# Patient Record
Sex: Female | Born: 1950 | Race: White | Hispanic: No | Marital: Married | Smoking: Never smoker
Health system: Southern US, Community
[De-identification: ages and names within clinical notes are randomized; demographics above are authoritative.]

## PROBLEM LIST (undated history)

## (undated) DIAGNOSIS — Q828 Other specified congenital malformations of skin: Secondary | ICD-10-CM

## (undated) DIAGNOSIS — Z86711 Personal history of pulmonary embolism: Secondary | ICD-10-CM

## (undated) DIAGNOSIS — R350 Frequency of micturition: Secondary | ICD-10-CM

## (undated) DIAGNOSIS — I10 Essential (primary) hypertension: Secondary | ICD-10-CM

## (undated) DIAGNOSIS — I739 Peripheral vascular disease, unspecified: Secondary | ICD-10-CM

## (undated) DIAGNOSIS — M1990 Unspecified osteoarthritis, unspecified site: Secondary | ICD-10-CM

## (undated) DIAGNOSIS — Z833 Family history of diabetes mellitus: Secondary | ICD-10-CM

## (undated) DIAGNOSIS — H269 Unspecified cataract: Secondary | ICD-10-CM

## (undated) DIAGNOSIS — T7840XA Allergy, unspecified, initial encounter: Secondary | ICD-10-CM

## (undated) HISTORY — PX: KNEE ARTHROSCOPY: SUR90

## (undated) HISTORY — DX: Personal history of pulmonary embolism: Z86.711

## (undated) HISTORY — DX: Unspecified osteoarthritis, unspecified site: M19.90

## (undated) HISTORY — PX: JOINT REPLACEMENT: SHX530

## (undated) HISTORY — PX: OTHER SURGICAL HISTORY: SHX169

## (undated) HISTORY — DX: Essential (primary) hypertension: I10

## (undated) HISTORY — DX: Other specified congenital malformations of skin: Q82.8

## (undated) HISTORY — PX: TONSILLECTOMY: SUR1361

## (undated) HISTORY — DX: Unspecified cataract: H26.9

## (undated) HISTORY — DX: Family history of diabetes mellitus: Z83.3

## (undated) HISTORY — DX: Allergy, unspecified, initial encounter: T78.40XA

---

## 1981-04-15 HISTORY — PX: TUBAL LIGATION: SHX77

## 2004-09-19 ENCOUNTER — Ambulatory Visit: Payer: Self-pay | Admitting: Family Medicine

## 2004-10-02 ENCOUNTER — Encounter: Admission: RE | Admit: 2004-10-02 | Discharge: 2004-10-02 | Payer: Self-pay | Admitting: Family Medicine

## 2004-11-08 ENCOUNTER — Ambulatory Visit: Payer: Self-pay | Admitting: Family Medicine

## 2004-11-20 ENCOUNTER — Other Ambulatory Visit: Admission: RE | Admit: 2004-11-20 | Discharge: 2004-11-20 | Payer: Self-pay | Admitting: Family Medicine

## 2004-11-20 ENCOUNTER — Ambulatory Visit: Payer: Self-pay | Admitting: Family Medicine

## 2004-11-26 ENCOUNTER — Encounter: Admission: RE | Admit: 2004-11-26 | Discharge: 2004-11-26 | Payer: Self-pay | Admitting: Family Medicine

## 2004-12-06 ENCOUNTER — Ambulatory Visit: Payer: Self-pay | Admitting: Internal Medicine

## 2005-01-03 ENCOUNTER — Ambulatory Visit (HOSPITAL_COMMUNITY): Admission: RE | Admit: 2005-01-03 | Discharge: 2005-01-03 | Payer: Self-pay | Admitting: Obstetrics and Gynecology

## 2005-01-03 ENCOUNTER — Encounter (INDEPENDENT_AMBULATORY_CARE_PROVIDER_SITE_OTHER): Payer: Self-pay | Admitting: Specialist

## 2005-02-04 ENCOUNTER — Ambulatory Visit: Payer: Self-pay | Admitting: Family Medicine

## 2005-11-07 ENCOUNTER — Encounter: Admission: RE | Admit: 2005-11-07 | Discharge: 2005-11-07 | Payer: Self-pay | Admitting: Family Medicine

## 2007-06-01 ENCOUNTER — Ambulatory Visit: Payer: Self-pay | Admitting: Family Medicine

## 2007-06-01 DIAGNOSIS — Q828 Other specified congenital malformations of skin: Secondary | ICD-10-CM | POA: Insufficient documentation

## 2008-04-21 ENCOUNTER — Encounter: Payer: Self-pay | Admitting: Family Medicine

## 2008-07-12 ENCOUNTER — Encounter: Admission: RE | Admit: 2008-07-12 | Discharge: 2008-07-12 | Payer: Self-pay | Admitting: Obstetrics and Gynecology

## 2009-06-20 ENCOUNTER — Ambulatory Visit: Payer: Self-pay | Admitting: Family Medicine

## 2009-06-20 DIAGNOSIS — R062 Wheezing: Secondary | ICD-10-CM | POA: Insufficient documentation

## 2009-10-17 ENCOUNTER — Ambulatory Visit: Payer: Self-pay | Admitting: Family Medicine

## 2009-10-19 LAB — CONVERTED CEMR LAB
ALT: 13 units/L (ref 0–35)
AST: 14 units/L (ref 0–37)
Albumin: 3.9 g/dL (ref 3.5–5.2)
Alkaline Phosphatase: 116 units/L (ref 39–117)
BUN: 20 mg/dL (ref 6–23)
Basophils Absolute: 0.1 10*3/uL (ref 0.0–0.1)
Basophils Relative: 0.5 % (ref 0.0–3.0)
Bilirubin, Direct: 0.2 mg/dL (ref 0.0–0.3)
CO2: 31 meq/L (ref 19–32)
Calcium: 9 mg/dL (ref 8.4–10.5)
Chloride: 107 meq/L (ref 96–112)
Cholesterol: 185 mg/dL (ref 0–200)
Creatinine, Ser: 0.6 mg/dL (ref 0.4–1.2)
Eosinophils Absolute: 0.4 10*3/uL (ref 0.0–0.7)
Eosinophils Relative: 3.9 % (ref 0.0–5.0)
GFR calc non Af Amer: 100.24 mL/min (ref >60)
Glucose, Bld: 91 mg/dL (ref 70–99)
HCT: 39.6 % (ref 36.0–46.0)
HDL: 49.9 mg/dL (ref >39.00)
Hemoglobin: 13.3 g/dL (ref 12.0–15.0)
LDL Cholesterol: 115 mg/dL — ABNORMAL HIGH (ref 0–99)
Lymphocytes Relative: 23.4 % (ref 12.0–46.0)
Lymphs Abs: 2.6 10*3/uL (ref 0.7–4.0)
MCHC: 33.7 g/dL (ref 30.0–36.0)
MCV: 88.8 fL (ref 78.0–100.0)
Monocytes Absolute: 0.9 10*3/uL (ref 0.1–1.0)
Monocytes Relative: 8 % (ref 3.0–12.0)
Neutro Abs: 7.2 10*3/uL (ref 1.4–7.7)
Neutrophils Relative %: 64.2 % (ref 43.0–77.0)
Platelets: 328 10*3/uL (ref 150.0–400.0)
Potassium: 4.8 meq/L (ref 3.5–5.1)
RBC: 4.46 M/uL (ref 3.87–5.11)
RDW: 13.3 % (ref 11.5–14.6)
Sodium: 142 meq/L (ref 135–145)
TSH: 2.66 microintl units/mL (ref 0.35–5.50)
Total Bilirubin: 0.7 mg/dL (ref 0.3–1.2)
Total CHOL/HDL Ratio: 4
Total Protein: 6.8 g/dL (ref 6.0–8.3)
Triglycerides: 99 mg/dL (ref 0.0–149.0)
VLDL: 19.8 mg/dL (ref 0.0–40.0)
WBC: 11.2 10*3/uL — ABNORMAL HIGH (ref 4.5–10.5)

## 2009-10-20 ENCOUNTER — Telehealth: Payer: Self-pay | Admitting: Family Medicine

## 2009-11-13 DIAGNOSIS — Z86711 Personal history of pulmonary embolism: Secondary | ICD-10-CM

## 2009-11-13 HISTORY — PX: TOTAL KNEE ARTHROPLASTY: SHX125

## 2009-11-13 HISTORY — DX: Personal history of pulmonary embolism: Z86.711

## 2009-12-01 ENCOUNTER — Encounter: Payer: Self-pay | Admitting: Family Medicine

## 2009-12-01 ENCOUNTER — Inpatient Hospital Stay (HOSPITAL_COMMUNITY): Admission: RE | Admit: 2009-12-01 | Discharge: 2009-12-05 | Payer: Self-pay | Admitting: Orthopedic Surgery

## 2009-12-05 ENCOUNTER — Encounter: Payer: Self-pay | Admitting: Family Medicine

## 2009-12-19 ENCOUNTER — Encounter: Payer: Self-pay | Admitting: Family Medicine

## 2009-12-20 ENCOUNTER — Telehealth: Payer: Self-pay | Admitting: Family Medicine

## 2009-12-27 ENCOUNTER — Encounter: Payer: Self-pay | Admitting: Family Medicine

## 2009-12-28 ENCOUNTER — Telehealth: Payer: Self-pay | Admitting: Family Medicine

## 2010-01-02 ENCOUNTER — Ambulatory Visit: Payer: Self-pay | Admitting: Family Medicine

## 2010-01-02 DIAGNOSIS — Z86711 Personal history of pulmonary embolism: Secondary | ICD-10-CM | POA: Insufficient documentation

## 2010-01-02 LAB — CONVERTED CEMR LAB
INR: 2.6
Prothrombin Time: 31.1 s

## 2010-01-09 ENCOUNTER — Ambulatory Visit: Payer: Self-pay | Admitting: Family Medicine

## 2010-01-09 LAB — CONVERTED CEMR LAB
INR: 2.4
Prothrombin Time: 28.6 s

## 2010-01-16 ENCOUNTER — Ambulatory Visit: Payer: Self-pay | Admitting: Family Medicine

## 2010-01-16 LAB — CONVERTED CEMR LAB
INR: 2.9
Prothrombin Time: 34.9 s

## 2010-01-23 ENCOUNTER — Ambulatory Visit: Payer: Self-pay | Admitting: Family Medicine

## 2010-01-23 LAB — CONVERTED CEMR LAB
INR: 2.6
Prothrombin Time: 31.5 s

## 2010-02-06 ENCOUNTER — Ambulatory Visit: Payer: Self-pay | Admitting: Family Medicine

## 2010-02-06 LAB — CONVERTED CEMR LAB
INR: 2.4
Prothrombin Time: 28.3 s

## 2010-03-06 ENCOUNTER — Ambulatory Visit: Payer: Self-pay | Admitting: Family Medicine

## 2010-03-06 LAB — CONVERTED CEMR LAB
INR: 2.3
Prothrombin Time: 27 s

## 2010-04-03 ENCOUNTER — Ambulatory Visit: Payer: Self-pay | Admitting: Family Medicine

## 2010-04-03 LAB — CONVERTED CEMR LAB
INR: 1.9
Prothrombin Time: 22.5 s

## 2010-04-10 ENCOUNTER — Ambulatory Visit: Payer: Self-pay | Admitting: Family Medicine

## 2010-04-10 LAB — CONVERTED CEMR LAB
INR: 2.6
Prothrombin Time: 30.6 s

## 2010-04-19 ENCOUNTER — Ambulatory Visit
Admission: RE | Admit: 2010-04-19 | Discharge: 2010-04-19 | Payer: Self-pay | Source: Home / Self Care | Attending: Family Medicine | Admitting: Family Medicine

## 2010-04-19 LAB — CONVERTED CEMR LAB
INR: 2.5
Prothrombin Time: 30.4 s

## 2010-05-03 ENCOUNTER — Ambulatory Visit
Admission: RE | Admit: 2010-05-03 | Discharge: 2010-05-03 | Payer: Self-pay | Source: Home / Self Care | Attending: Family Medicine | Admitting: Family Medicine

## 2010-05-03 LAB — CONVERTED CEMR LAB
INR: 2.4
Prothrombin Time: 28.4 s

## 2010-05-15 NOTE — Miscellaneous (Signed)
Summary: Refusal/[facility]  Refusal/[facility]   Imported By: Lester [place] 01/02/2010 10:39:31  _____________________________________________________________________  External Attachment:    Type:   Image     Comment:   External Document

## 2010-05-15 NOTE — Letter (Signed)
Summary: Request for Medical Clearance Letter,Dr.Norris,[facility]  Request for Medical Clearance Letter,Dr.Norris,[facility]   Imported By: Beau Fanny 10/19/2009 13:38:59  _____________________________________________________________________  External Attachment:    Type:   Image     Comment:   External Document

## 2010-05-15 NOTE — Assessment & Plan Note (Signed)
Summary: SURGICAL CLEARANCE/DLO   Vital Signs:  Patient profile:   60 year old female Height:      63 inches Weight:      263.50 pounds BMI:     46.85 Temp:     97.9 degrees F oral Pulse rate:   80 / minute Pulse rhythm:   regular BP sitting:   136 / 88  (left arm) Cuff size:   large  Vitals Entered By: Lewanda Rife LPN (October 17, 1608 9:49 AM) CC: surgical clearance for knee replacement   History of Present Illness: here for medical clearance for knee replacement   Dr Ranell Patrick will do surgery upcoming  pain is really bad in her L knee ( R will be next year )  has been dealing with this for a long time  GM had OA too   no heart probs or lung probs no asthma  has allergies    is all to pcn with swelling  had 3 surg in past  no problems with anesthesia in past   has let herself go -- (after loosing 50 lb with wt watchers)  has had to care for her father before he passed   EKG -- ok today        Allergies: 1)  ! Pcn  Past History:  Family History: Last updated: 11/10/2009 mother died of severe cervical cancer , and early DM2 , HH  father -- died of mult factors (? ) , cabg with severe coronary artery disease, bad DM2 , bladder cancer , arrhythmia , gout  PGM -- OA  PGF died of ca and DM  MGF - "fluid in his lungs" MGM with parkinson's dz   Social History: Last updated: 11/10/2009 no tobacco rarely alcohol  walking for exercise when able   Past Medical History: Osteoarthritis Allergic Rhinitis  Past Surgical History: 3 arthroscopic surgeries in both knees BTL uterine bx for fibroid tumors   Family History: mother died of severe cervical cancer , and early DM2 , HH  father -- died of mult factors (? ) , cabg with severe coronary artery disease, bad DM2 , bladder cancer , arrhythmia , gout  PGM -- OA  PGF died of ca and DM  MGF - "fluid in his lungs" MGM with parkinson's dz   Social History: no tobacco rarely alcohol  walking for exercise  when able   Review of Systems General:  Denies fatigue, loss of appetite, and malaise. Eyes:  Denies blurring and eye irritation. CV:  Denies chest pain or discomfort, lightheadness, and palpitations. Resp:  Denies cough, shortness of breath, and wheezing. GI:  Denies abdominal pain, bloody stools, change in bowel habits, indigestion, and nausea. GU:  Denies urinary frequency. MS:  Complains of joint pain; denies joint redness, cramps, and muscle weakness. Derm:  Denies itching, lesion(s), poor wound healing, and rash. Neuro:  Denies headaches, numbness, and tingling. Psych:  Denies anxiety and depression. Endo:  Denies cold intolerance, excessive thirst, excessive urination, and heat intolerance. Heme:  Denies abnormal bruising and bleeding.  Physical Exam  General:  obese but well appearing Head:  normocephalic, atraumatic, and no abnormalities observed.   Eyes:  vision grossly intact, pupils equal, pupils round, and pupils reactive to light.  no conjunctival pallor, injection or icterus  Nose:  no nasal discharge.   Mouth:  pharynx pink and moist.   Neck:  supple with full rom and no masses or thyromegally, no JVD or carotid bruit  Chest Wall:  No deformities, masses, or tenderness noted. Lungs:  Normal respiratory effort, chest expands symmetrically. Lungs are clear to auscultation, no crackles or wheezes. Heart:  Normal rate and regular rhythm. S1 and S2 normal without gallop, murmur, click, rub or other extra sounds. Abdomen:  Bowel sounds positive,abdomen soft and non-tender without masses, organomegaly or hernias noted. no renal bruits  Msk:  No deformity or scoliosis noted of thoracic or lumbar spine.  poor rom both knees no other joint changes  Pulses:  R and L carotid,radial,femoral,dorsalis pedis and posterior tibial pulses are full and equal bilaterally Extremities:  No clubbing, cyanosis, edema, or deformity noted with normal full range of motion of all joints.     Neurologic:  sensation intact to light touch, gait normal, and DTRs symmetrical and normal.   Skin:  Intact without suspicious lesions or rashes Cervical Nodes:  No lymphadenopathy noted Inguinal Nodes:  No significant adenopathy Psych:  normal affect, talkative and pleasant    Impression & Recommendations:  Problem # 1:  OTHER SPECIFIED PRE-OPERATIVE EXAMINATION (ICD-V72.83) Assessment New  with no restrictions for upcoming knee repl only risk factor is obesity- made plan for wt loss  nl EKG  lab today  noted pcn allergy no probs with anethesia in past f/u for health mt exam in fall  will send info to Dr Ranell Patrick   Orders: EKG w/ Interpretation (93000)  Problem # 2:  DIABETES MELLITUS, FAMILY HX (ICD-V18.0) Assessment: New check fasting sugar today disc healthy diet (low simple sugar/ choose complex carbs/ low sat fat) diet and exercise in detail  Orders: Venipuncture (16109) TLB-Lipid Panel (80061-LIPID) TLB-BMP (Basic Metabolic Panel-BMET) (80048-METABOL) TLB-Hepatic/Liver Function Pnl (80076-HEPATIC) TLB-CBC Platelet - w/Differential (85025-CBCD) TLB-TSH (Thyroid Stimulating Hormone) (84443-TSH) EKG w/ Interpretation (93000)  Problem # 3:  SCREENING FOR LIPOID DISORDERS (ICD-V77.91) Assessment: New with diet that is improving rev low sat fat diet  Orders: Venipuncture (60454) TLB-Lipid Panel (80061-LIPID) TLB-BMP (Basic Metabolic Panel-BMET) (80048-METABOL) TLB-Hepatic/Liver Function Pnl (80076-HEPATIC) TLB-CBC Platelet - w/Differential (85025-CBCD) TLB-TSH (Thyroid Stimulating Hormone) (84443-TSH) EKG w/ Interpretation (93000)  Complete Medication List: 1)  Celebrex 200 Mg Caps (Celecoxib) .... As needed 2)  Proair Hfa 108 (90 Base) Mcg/act Aers (Albuterol sulfate) .... 2 inh q4h as needed shortness of breath 3)  Aleve 220 Mg Tabs (Naproxen sodium) .... Otc as directed.  Patient Instructions: 1)  no restrictions for upcoming surgery  2)  will send info to  Dr Ranell Patrick  3)  labs today  4)  schedule PE in the fall please  5)  work on weight loss   Current Allergies (reviewed today): ! PCN  EKG  Procedure date:  10/17/2009  Findings:      NSR with rate of 80 and no acute changes    Appended Document: SURGICAL CLEARANCE/DLO 10/17/09 office notes and EKG was faxed to Natasha Mead at [phone] as instructed.

## 2010-05-15 NOTE — Procedures (Signed)
Summary: Oximetry Order/[facility]  Oximetry Order/[facility]   Imported By: Lanelle Bal 12/26/2009 12:53:55  _____________________________________________________________________  External Attachment:    Type:   Image     Comment:   External Document

## 2010-05-15 NOTE — Letter (Signed)
Summary: [facility]  [facility]   Imported By: Lanelle Bal 12/26/2009 12:54:47  _____________________________________________________________________  External Attachment:    Type:   Image     Comment:   External Document

## 2010-05-15 NOTE — Progress Notes (Signed)
Summary: [name]  Phone Note Call from Patient Call back at Work Phone [phone]   Caller: Patient Summary of Call: Patient called to let you know that she just recieved her lab results from the phone tree and she has been receiving cortizone shots for the last year, her last one was on 07-25-09. Initial call taken by: Melody Comas,  October 20, 2009 1:10 PM  Follow-up for Phone Call        thanks for the update - cortisone shots could mildly inc the wbc  Follow-up by: Judith Part MD,  October 20, 2009 1:19 PM  Additional Follow-up for Phone Call Additional follow up Details #1::        Patient notified as instructed by telephone. Pt also thanked Dr Milinda Antis for sending the info to the orthopedic Dr. so promptly.Lewanda Rife LPN  October 21, 2954 2:54 PM

## 2010-05-15 NOTE — Progress Notes (Signed)
Summary: pt will need PT/INR monitoring  Phone Note From Other Clinic   Caller: Cheryl with [facility]  Summary of Call: Nurse called to report that pt was discharged from home health last friday after having a total knee replacement.  Pt will need to stay on coumadin for 6 months due to hx of DVT, PE.  She said that you will need to monitor her coumadin, ortho will no longer be monitoring. Initial call taken by: Lowella Petties CMA,  December 28, 2009 4:08 PM  Follow-up for Phone Call        make her appt for new INR and have her bring her previous protimes with her  Follow-up by: Judith Part MD,  December 28, 2009 4:25 PM  Additional Follow-up for Phone Call Additional follow up Details #1::        Patient notified as instructed by telephone. Pt said the insurance has approved the nurses to come out next Tues and Fri for her INR at home. Pt scheduled lab appt for INR on09/27/11 at 11am. and will bring her previous protimes. If anything changes with home health pt will let us know.Lewanda Rife LPN  December 29, 2009 11:01 AM      Appended Document: pt will need PT/INR monitoring Pt called to report that her home health nursing stopped last week so pt wil be coming her for her protimes this week.  Lab appt made for tomorrow.

## 2010-05-15 NOTE — Progress Notes (Signed)
Summary: Pt does not want O2 level tested  Phone Note Call from Patient Call back at [phone]   Caller: Patient Call For: Dr Milinda Antis Summary of Call: [name] with [facility] called pt to let her know he was on his way to her home to monitor her O2 level. Pt said her oxygen level during the day at the hospital was 90-95. No one told her her O2 level got into the 80's at night. Pt said she had a clot in her lung while she was in the hospital but she feels fine now and does not want to have this test done. Please advise.  Initial call taken by: Lewanda Rife LPN,  December 20, 2009 2:52 PM  Follow-up for Phone Call        that is her choice - can cancel the referral Follow-up by: Judith Part MD,  December 20, 2009 3:39 PM  Additional Follow-up for Phone Call Additional follow up Details #1::        Selena Batten at [facility] solutions [phone] notified to cancel referral. Patient notified as instructed by telephone. Lewanda Rife LPN  December 20, 2009 3:49 PM

## 2010-05-15 NOTE — Assessment & Plan Note (Signed)
Summary: 10:15 COUGH/CLE   Vital Signs:  Patient profile:   60 year old female Height:      63 inches Weight:      262.2 pounds BMI:     46.61 O2 Sat:      95 % on Room air Temp:     98.0 degrees F oral Pulse rate:   84 / minute Pulse rhythm:   regular BP sitting:   140 / 86  (left arm) Cuff size:   large  Vitals Entered By: Benny Lennert CMA (AAMA) (June 20, 2009 10:14 AM)  O2 Flow:  Room air  History of Present Illness: Chief complaint cough, chest congestion, chills,achy, coughing up yellow mucous  60 year old female:  Thursday night, started to get congested some and worke up really ad and was coughing really bad  got some chills. That afternoon was under the covers, then fever broke. Was sweating a lot. Now with cold and congestion. Got some aching around lunch time on Friday.  Since friday was taking some mucinex and nyquil.  Has been wheezing since last week, no history of asthma or COPD    Allergies: 1)  ! Pcn  Past History:  Past Medical History: Osteoarthritis Allergic Rhinitis  Social History: Reviewed history and no changes required. no tobacco  Review of Systems      See HPI General:  Complains of chills, fatigue, fever, and sweats. CV:  Denies chest pain or discomfort. Resp:  Complains of cough, sputum productive, and wheezing. GI:  Denies nausea and vomiting.  Physical Exam  General:  Well-developed,well-nourished,in no acute distress; alert,appropriate and cooperative throughout examination Head:  Normocephalic and atraumatic without obvious abnormalities. No apparent alopecia or balding. Ears:  External ear exam shows no significant lesions or deformities.  Otoscopic examination reveals clear canals, tympanic membranes are intact bilaterally without bulging, retraction, inflammation or discharge. Hearing is grossly normal bilaterally. Nose:  External nasal examination shows no deformity or inflammation. Nasal mucosa are pink and moist  without lesions or exudates. Mouth:  Oral mucosa and oropharynx without lesions or exudates.  Teeth in good repair. Neck:  No deformities, masses, or tenderness noted. Lungs:  normal respiratory effort, no intercostal retractions, and no accessory muscle use.    Wheezes diffusely with crackles, R > L lung base Heart:  Normal rate and regular rhythm. S1 and S2 normal without gallop, murmur, click, rub or other extra sounds. Extremities:  No clubbing, cyanosis, edema, or deformity noted with normal full range of motion of all joints.   Neurologic:  alert & oriented X3 and gait normal.   Cervical Nodes:  No lymphadenopathy noted Psych:  Cognition and judgment appear intact. Alert and cooperative with normal attention span and concentration. No apparent delusions, illusions, hallucinations   Impression & Recommendations:  Problem # 1:  BRONCHITIS- ACUTE (ICD-466.0) Assessment New Probable influenza last week, but concerning exam with active wheezing  start abx, albuterol as needed   Her updated medication list for this problem includes:    Azithromycin 250 Mg Tabs (Azithromycin) .Marland Kitchen... 2 by  mouth today and then 1 daily for 4 days    Proair Hfa 108 (90 Base) Mcg/act Aers (Albuterol sulfate) .Marland Kitchen... 2 inh q4h as needed shortness of breath  Problem # 2:  WHEEZING (ICD-786.07) Assessment: New  Complete Medication List: 1)  Celebrex 200 Mg Caps (Celecoxib) .... As needed 2)  Azithromycin 250 Mg Tabs (Azithromycin) .... 2 by  mouth today and then 1 daily for 4  days 3)  Proair Hfa 108 (90 Base) Mcg/act Aers (Albuterol sulfate) .... 2 inh q4h as needed shortness of breath Prescriptions: PROAIR HFA 108 (90 BASE) MCG/ACT  AERS (ALBUTEROL SULFATE) 2 inh q4h as needed shortness of breath  #1 x 0   Entered and Authorized by:   Hannah Beat MD   Signed by:   Hannah Beat MD on 06/20/2009   Method used:   Electronically to        [facility]  [id number] (retail)       [address]       [place], [place]  [address]       Ph: [phone] or [phone]       Fax: [phone]   RxID:   [id number] AZITHROMYCIN 250 MG  TABS (AZITHROMYCIN) 2 by  mouth today and then 1 daily for 4 days  #6 x 0   Entered and Authorized by:   Hannah Beat MD   Signed by:   Hannah Beat MD on 06/20/2009   Method used:   [facility] to        [facility]  [address] (retail)       [address]       [place], [place]  [address]       Ph: [phone] or [phone]       Fax: [phone]   RxID:   [id number]   Current Allergies (reviewed today): ! PCN

## 2010-05-18 NOTE — Op Note (Signed)
Summary: Knee Surgery/[facility]  Knee Surgery/[facility]   Imported By: Lanelle Bal 12/26/2009 12:55:49  _____________________________________________________________________  External Attachment:    Type:   Image     Comment:   External Document

## 2010-05-31 ENCOUNTER — Encounter: Payer: Self-pay | Admitting: Family Medicine

## 2010-05-31 ENCOUNTER — Ambulatory Visit: Payer: Self-pay

## 2010-05-31 ENCOUNTER — Ambulatory Visit (INDEPENDENT_AMBULATORY_CARE_PROVIDER_SITE_OTHER): Payer: BC Managed Care – PPO

## 2010-05-31 DIAGNOSIS — I2699 Other pulmonary embolism without acute cor pulmonale: Secondary | ICD-10-CM

## 2010-05-31 DIAGNOSIS — Z7901 Long term (current) use of anticoagulants: Secondary | ICD-10-CM

## 2010-05-31 DIAGNOSIS — Z5181 Encounter for therapeutic drug level monitoring: Secondary | ICD-10-CM

## 2010-05-31 LAB — CONVERTED CEMR LAB
INR: 2.4
Prothrombin Time: 29.2 s

## 2010-06-12 NOTE — Medication Information (Signed)
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 Pcn  Anticoagulation Management History:      Negative risk factors for bleeding include an age less than 62 years old.  The bleeding index is 'low risk'.  Negative CHADS2 values include Age > 58 years old.  Her last INR was 2.4 and today's INR is 2.4.  Prothrombin time is 29.2.    Anticoagulation Management Assessment/Plan:      The next INR is due dint sched, will dc coumadin next week.        Laboratory Results   Blood Tests   Date/Time Recieved: May 31, 2010 3:02 PM  Date/Time Reported: May 31, 2010 3:02 PM   PT: 29.2 s   (Normal Range: 10.6-13.4)  INR: 2.4   (Normal Range: 0.88-1.12   Therap INR: 2.0-3.5)      ANTICOAGULATION RECORD PREVIOUS REGIMEN & LAB RESULTS Anticoagulation Diagnosis:  415.19 PE on  01/02/2010 Previous INR Goal Range:  2.5-3.5 on  01/02/2010 Previous INR:  2.4 on  05/03/2010 Previous Coumadin Dose(mg):  2.5/5/2.5/5 on  01/16/2010 Previous Regimen:  5mg  qd on  04/19/2010  NEW REGIMEN & LAB RESULTS Current INR: 2.4 Regimen: 5mg  qd  (no change) Coagulation Comments: missed x 1 dose 3 days ago Provider: Trino Higinbotham      Repeat testing in: dint sched, will dc coumadin next week Other Comments: will dc coumadin on 06/04/10.   MEDICATIONS CELEBREX 200 MG CAPS (CELECOXIB) as needed PROAIR HFA 108 (90 BASE) MCG/ACT  AERS (ALBUTEROL SULFATE) 2 inh q4h as needed shortness of breath ALEVE 220 MG TABS (NAPROXEN SODIUM) OTC As directed. METHOCARBAMOL 500 MG TABS (METHOCARBAMOL) Take 1 tablet by mouth once a day as needed OXYCODONE-ACETAMINOPHEN 7.5-325 MG TABS (OXYCODONE-ACETAMINOPHEN) Take 1 tablet by mouth once a day as needed KETOROLAC TROMETHAMINE 10 MG TABS (KETOROLAC TROMETHAMINE) Take 1 tablet by mouth three times a day  Dose has been reviewed with patient or caretaker during this visit.  Reviewed by: Allison Quarry  Anticoagulation  Visit Questionnaire      Coumadin dose missed/changed:  Yes      Coumadin Dose Comments:  one or more missed dose(s)      Abnormal Bleeding Symptoms:  No Any diet changes including alcohol intake, vegetables or greens since the last visit:  No Any illnesses or hospitalizations since the last visit:  No Any signs of clotting since the last visit (including chest discomfort, dizziness, shortness of breath, arm tingling, slurred speech, swelling or redness in leg):  No

## 2010-06-29 LAB — BASIC METABOLIC PANEL
BUN: 13 mg/dL (ref 6–23)
BUN: 5 mg/dL — ABNORMAL LOW (ref 6–23)
BUN: 6 mg/dL (ref 6–23)
BUN: 7 mg/dL (ref 6–23)
CO2: 25 mEq/L (ref 19–32)
CO2: 25 mEq/L (ref 19–32)
CO2: 25 mEq/L (ref 19–32)
CO2: 29 mEq/L (ref 19–32)
Calcium: 8.1 mg/dL — ABNORMAL LOW (ref 8.4–10.5)
Calcium: 8.2 mg/dL — ABNORMAL LOW (ref 8.4–10.5)
Calcium: 8.4 mg/dL (ref 8.4–10.5)
Calcium: 9.9 mg/dL (ref 8.4–10.5)
Chloride: 106 mEq/L (ref 96–112)
Chloride: 107 mEq/L (ref 96–112)
Chloride: 108 mEq/L (ref 96–112)
Chloride: 111 mEq/L (ref 96–112)
Creatinine, Ser: 0.59 mg/dL (ref 0.4–1.2)
Creatinine, Ser: 0.73 mg/dL (ref 0.4–1.2)
Creatinine, Ser: 0.74 mg/dL (ref 0.4–1.2)
Creatinine, Ser: 0.77 mg/dL (ref 0.4–1.2)
GFR calc Af Amer: >60 mL/min (ref >60)
GFR calc Af Amer: >60 mL/min (ref >60)
GFR calc Af Amer: >60 mL/min (ref >60)
GFR calc Af Amer: >60 mL/min (ref >60)
GFR calc non Af Amer: >60 mL/min (ref >60)
GFR calc non Af Amer: >60 mL/min (ref >60)
GFR calc non Af Amer: >60 mL/min (ref >60)
GFR calc non Af Amer: >60 mL/min (ref >60)
Glucose, Bld: 103 mg/dL — ABNORMAL HIGH (ref 70–99)
Glucose, Bld: 131 mg/dL — ABNORMAL HIGH (ref 70–99)
Glucose, Bld: 136 mg/dL — ABNORMAL HIGH (ref 70–99)
Glucose, Bld: 136 mg/dL — ABNORMAL HIGH (ref 70–99)
Potassium: 3.4 mEq/L — ABNORMAL LOW (ref 3.5–5.1)
Potassium: 3.4 mEq/L — ABNORMAL LOW (ref 3.5–5.1)
Potassium: 3.9 mEq/L (ref 3.5–5.1)
Potassium: 4.6 mEq/L (ref 3.5–5.1)
Sodium: 138 mEq/L (ref 135–145)
Sodium: 139 mEq/L (ref 135–145)
Sodium: 141 mEq/L (ref 135–145)
Sodium: 142 mEq/L (ref 135–145)

## 2010-06-29 LAB — DIFFERENTIAL
Basophils Absolute: 0.1 10*3/uL (ref 0.0–0.1)
Basophils Absolute: 0.1 10*3/uL (ref 0.0–0.1)
Basophils Relative: 0 % (ref 0–1)
Basophils Relative: 0 % (ref 0–1)
Eosinophils Absolute: 0.3 10*3/uL (ref 0.0–0.7)
Eosinophils Absolute: 0.3 10*3/uL (ref 0.0–0.7)
Eosinophils Relative: 2 % (ref 0–5)
Eosinophils Relative: 3 % (ref 0–5)
Lymphocytes Relative: 16 % (ref 12–46)
Lymphocytes Relative: 27 % (ref 12–46)
Lymphs Abs: 2.2 10*3/uL (ref 0.7–4.0)
Lymphs Abs: 3.3 10*3/uL (ref 0.7–4.0)
Monocytes Absolute: 0.8 10*3/uL (ref 0.1–1.0)
Monocytes Absolute: 1.5 10*3/uL — ABNORMAL HIGH (ref 0.1–1.0)
Monocytes Relative: 11 % (ref 3–12)
Monocytes Relative: 7 % (ref 3–12)
Neutro Abs: 7.8 10*3/uL — ABNORMAL HIGH (ref 1.7–7.7)
Neutro Abs: 9.5 10*3/uL — ABNORMAL HIGH (ref 1.7–7.7)
Neutrophils Relative %: 64 % (ref 43–77)
Neutrophils Relative %: 70 % (ref 43–77)

## 2010-06-29 LAB — URINALYSIS, ROUTINE W REFLEX MICROSCOPIC
Bilirubin Urine: NEGATIVE
Glucose, UA: NEGATIVE mg/dL
Hgb urine dipstick: NEGATIVE
Ketones, ur: NEGATIVE mg/dL
Nitrite: NEGATIVE
Protein, ur: NEGATIVE mg/dL
Specific Gravity, Urine: 1.015 (ref 1.005–1.030)
Urobilinogen, UA: 0.2 mg/dL (ref 0.0–1.0)
pH: 5 (ref 5.0–8.0)

## 2010-06-29 LAB — CULTURE, BLOOD (ROUTINE X 2)
Culture: NO GROWTH
Culture: NO GROWTH

## 2010-06-29 LAB — CBC
HCT: 28.2 % — ABNORMAL LOW (ref 36.0–46.0)
HCT: 29.3 % — ABNORMAL LOW (ref 36.0–46.0)
HCT: 31.9 % — ABNORMAL LOW (ref 36.0–46.0)
HCT: 34.3 % — ABNORMAL LOW (ref 36.0–46.0)
HCT: 43.2 % (ref 36.0–46.0)
Hemoglobin: 10.3 g/dL — ABNORMAL LOW (ref 12.0–15.0)
Hemoglobin: 10.9 g/dL — ABNORMAL LOW (ref 12.0–15.0)
Hemoglobin: 14.4 g/dL (ref 12.0–15.0)
Hemoglobin: 9.2 g/dL — ABNORMAL LOW (ref 12.0–15.0)
Hemoglobin: 9.5 g/dL — ABNORMAL LOW (ref 12.0–15.0)
MCH: 28 pg (ref 26.0–34.0)
MCH: 28 pg (ref 26.0–34.0)
MCH: 28.2 pg (ref 26.0–34.0)
MCH: 28.8 pg (ref 26.0–34.0)
MCH: 29.3 pg (ref 26.0–34.0)
MCHC: 31.8 g/dL (ref 30.0–36.0)
MCHC: 32.3 g/dL (ref 30.0–36.0)
MCHC: 32.4 g/dL (ref 30.0–36.0)
MCHC: 32.6 g/dL (ref 30.0–36.0)
MCHC: 33.3 g/dL (ref 30.0–36.0)
MCV: 85.7 fL (ref 78.0–100.0)
MCV: 86.9 fL (ref 78.0–100.0)
MCV: 88 fL (ref 78.0–100.0)
MCV: 88.2 fL (ref 78.0–100.0)
MCV: 89.1 fL (ref 78.0–100.0)
Platelets: 192 10*3/uL (ref 150–400)
Platelets: 207 10*3/uL (ref 150–400)
Platelets: 262 10*3/uL (ref 150–400)
Platelets: 268 10*3/uL (ref 150–400)
Platelets: 338 10*3/uL (ref 150–400)
RBC: 3.29 MIL/uL — ABNORMAL LOW (ref 3.87–5.11)
RBC: 3.37 MIL/uL — ABNORMAL LOW (ref 3.87–5.11)
RBC: 3.58 MIL/uL — ABNORMAL LOW (ref 3.87–5.11)
RBC: 3.89 MIL/uL (ref 3.87–5.11)
RBC: 4.91 MIL/uL (ref 3.87–5.11)
RDW: 13.4 % (ref 11.5–15.5)
RDW: 13.5 % (ref 11.5–15.5)
RDW: 13.7 % (ref 11.5–15.5)
RDW: 13.9 % (ref 11.5–15.5)
RDW: 13.9 % (ref 11.5–15.5)
WBC: 12.2 10*3/uL — ABNORMAL HIGH (ref 4.0–10.5)
WBC: 13.6 10*3/uL — ABNORMAL HIGH (ref 4.0–10.5)
WBC: 14.4 10*3/uL — ABNORMAL HIGH (ref 4.0–10.5)
WBC: 17.6 10*3/uL — ABNORMAL HIGH (ref 4.0–10.5)
WBC: 18.7 10*3/uL — ABNORMAL HIGH (ref 4.0–10.5)

## 2010-06-29 LAB — PROTIME-INR
INR: 0.93 (ref 0.00–1.49)
INR: 1.1 (ref 0.00–1.49)
INR: 1.63 — ABNORMAL HIGH (ref 0.00–1.49)
INR: 2.88 — ABNORMAL HIGH (ref 0.00–1.49)
INR: 3.19 — ABNORMAL HIGH (ref 0.00–1.49)
Prothrombin Time: 12.7 seconds (ref 11.6–15.2)
Prothrombin Time: 14.4 seconds (ref 11.6–15.2)
Prothrombin Time: 19.5 seconds — ABNORMAL HIGH (ref 11.6–15.2)
Prothrombin Time: 30.2 seconds — ABNORMAL HIGH (ref 11.6–15.2)
Prothrombin Time: 32.7 seconds — ABNORMAL HIGH (ref 11.6–15.2)

## 2010-06-29 LAB — URINALYSIS, MICROSCOPIC ONLY
Bilirubin Urine: NEGATIVE
Glucose, UA: NEGATIVE mg/dL
Ketones, ur: 15 mg/dL — AB
Leukocytes, UA: NEGATIVE
Nitrite: NEGATIVE
Protein, ur: 30 mg/dL — AB
Specific Gravity, Urine: 1.027 (ref 1.005–1.030)
Urobilinogen, UA: 1 mg/dL (ref 0.0–1.0)
pH: 5.5 (ref 5.0–8.0)

## 2010-06-29 LAB — CARDIAC PANEL(CRET KIN+CKTOT+MB+TROPI)
CK, MB: 1 ng/mL (ref 0.3–4.0)
CK, MB: 3.2 ng/mL (ref 0.3–4.0)
CK, MB: 8.5 ng/mL (ref 0.3–4.0)
Relative Index: 0.9 (ref 0.0–2.5)
Relative Index: 1.6 (ref 0.0–2.5)
Relative Index: 2.7 — ABNORMAL HIGH (ref 0.0–2.5)
Total CK: 106 U/L (ref 7–177)
Total CK: 118 U/L (ref 7–177)
Total CK: 518 U/L — ABNORMAL HIGH (ref 7–177)
Troponin I: 0.02 ng/mL (ref 0.00–0.06)
Troponin I: 0.02 ng/mL (ref 0.00–0.06)
Troponin I: 0.02 ng/mL (ref 0.00–0.06)

## 2010-06-29 LAB — URINE MICROSCOPIC-ADD ON

## 2010-06-29 LAB — SURGICAL PCR SCREEN
MRSA, PCR: NEGATIVE
Staphylococcus aureus: NEGATIVE

## 2010-06-29 LAB — HEPARIN LEVEL (UNFRACTIONATED)
Heparin Unfractionated: 0.18 IU/mL — ABNORMAL LOW (ref 0.30–0.70)
Heparin Unfractionated: 0.18 IU/mL — ABNORMAL LOW (ref 0.30–0.70)
Heparin Unfractionated: 0.2 IU/mL — ABNORMAL LOW (ref 0.30–0.70)
Heparin Unfractionated: 0.26 IU/mL — ABNORMAL LOW (ref 0.30–0.70)
Heparin Unfractionated: 0.29 IU/mL — ABNORMAL LOW (ref 0.30–0.70)

## 2010-06-29 LAB — URINE CULTURE
Colony Count: NO GROWTH
Culture  Setup Time: 201108211832
Culture: NO GROWTH

## 2010-06-29 LAB — APTT: aPTT: 29 seconds (ref 24–37)

## 2010-06-29 LAB — ABO/RH: ABO/RH(D): A NEG

## 2010-06-29 LAB — TYPE AND SCREEN
ABO/RH(D): A NEG
Antibody Screen: NEGATIVE

## 2010-06-29 LAB — HEMOGLOBIN A1C
Hgb A1c MFr Bld: 5.9 % — ABNORMAL HIGH (ref <5.7)
Mean Plasma Glucose: 123 mg/dL — ABNORMAL HIGH (ref <117)

## 2010-06-29 LAB — MAGNESIUM: Magnesium: 2.2 mg/dL (ref 1.5–2.5)

## 2010-08-31 NOTE — Op Note (Signed)
NAMEMARGERT, [name]                 ACCOUNT NO.:  [id number]   MEDICAL RECORD NO.:  [id number]          PATIENT TYPE:  AMB   LOCATION:  [facility]                           FACILITY:  [facility]   PHYSICIAN:  Michelle L. Grewal, M.D.DATE OF BIRTH:  Apr 25, 1950   DATE OF PROCEDURE:  01/03/2005  DATE OF DISCHARGE:                                 OPERATIVE REPORT   PREOPERATIVE DIAGNOSIS:  Endometrial polyp   POSTOPERATIVE DIAGNOSIS:  Endometrial polyp   PROCEDURE:  D&C hysteroscopy.   SURGEON:  Michelle L. Vincente Poli, M.D.   ANESTHESIA:  MAC with local.   SPECIMENS:  Endometrial polyps.   ESTIMATED BLOOD LOSS:  Minimal.   COMPLICATIONS:  None.   DESCRIPTION OF PROCEDURE:  The patient is taken to the operating room. She  was given her sedation and placed in the lithotomy position, prepped and  draped. In-and-out catheter performed. Speculum inserted into the vagina.  Cervix was grasped with a tenaculum. The paracervical block was performed.  Uterus was sounded. The diagnostic hysteroscope was inserted into the uterus  with excellent visualization. A 1 cm endometrial polyp is visualized.  Hysteroscope removed. Curettage was performed thoroughly and polyp forceps  were used to remove the polyp. The hysteroscope was reinserted. Endometrium  was clean. The fluid deficit is negative 40 mL. All instruments removed from  vagina. All sponge, lap and instrument counts correct x2. Patient went to  recovery room in stable condition.      Michelle L. Vincente Poli, M.D.  Electronically Signed     MLG/MEDQ  D:  01/03/2005  T:  01/03/2005  Job:  [id number]

## 2011-01-31 ENCOUNTER — Encounter: Payer: Self-pay | Admitting: Family Medicine

## 2011-01-31 ENCOUNTER — Ambulatory Visit (INDEPENDENT_AMBULATORY_CARE_PROVIDER_SITE_OTHER): Payer: BC Managed Care – PPO | Admitting: Family Medicine

## 2011-01-31 ENCOUNTER — Encounter: Payer: Self-pay | Admitting: *Deleted

## 2011-01-31 VITALS — BP 136/78 | HR 88 | Temp 97.9°F | Ht 63.0 in | Wt 266.8 lb

## 2011-01-31 DIAGNOSIS — J329 Chronic sinusitis, unspecified: Secondary | ICD-10-CM

## 2011-01-31 MED ORDER — FLUTICASONE PROPIONATE 50 MCG/ACT NA SUSP: 2.0000 | Freq: Every day | NASAL | Status: DC

## 2011-01-31 NOTE — Patient Instructions (Signed)
You have a sinus infection, likely viral. Take medicine as prescribed: flonase to help with sinus congestion/inflammation. Push fluids and plenty of rest. Nasal saline irrigation or neti pot to help drain sinuses. May use simple mucinex or mucinex D with plenty of fluid to help mobilize mucous. May continue nyquil and other over the counter meds. Let me know if fever >101.5, trouble opening/closing mouth, difficulty swallowing, or worsening, or symptoms going on past 10 days.

## 2011-01-31 NOTE — Progress Notes (Signed)
  Subjective:    Patient ID: [name], female    DOB: 1950/05/04, 60 y.o.   MRN: [id number]  HPI CC: sinusitis?  5d h/o ST, severe sinus congestion.  Each day getting worse.  Taking mucinex D and delsym, cough drops, nyquil at night.  Not really helping.  Some coughing, feels like stuck in chest.  Some chest soreness when coughing.  When talking, worse cough.  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  At night cough comes on deeper and stronger.  No fevers/chills, HA, abd pain, n/v/d,myalgias,arthralgias.  No allergy sxs.  No sick contacts at home, no smokers at home.  No h/o asthma, COPD.  H/o seasonal allergies.  Review of Systems Per HPI    Objective:   Physical Exam  Nursing note and vitals reviewed. Constitutional: She appears well-developed and well-nourished. No distress.  HENT:  Head: Normocephalic and atraumatic.  Right Ear: Hearing, tympanic membrane, external ear and ear canal normal.  Left Ear: Hearing, tympanic membrane, external ear and ear canal normal.  Nose: Mucosal edema present. No rhinorrhea. Right sinus exhibits no maxillary sinus tenderness and no frontal sinus tenderness. Left sinus exhibits no maxillary sinus tenderness and no frontal sinus tenderness.  Mouth/Throat: Uvula is midline, oropharynx is clear and moist and mucous membranes are normal. No oropharyngeal exudate, posterior oropharyngeal edema, posterior oropharyngeal erythema or tonsillar abscesses.       L>R turbinate swelling  Eyes: Conjunctivae and EOM are normal. Pupils are equal, round, and reactive to light. No scleral icterus.  Neck: Normal range of motion. Neck supple. No JVD present. No thyromegaly present.  Cardiovascular: Normal rate, regular rhythm, normal heart sounds and intact distal pulses.   No murmur heard. Pulmonary/Chest: Effort normal and breath sounds normal. No respiratory distress. She has no wheezes. She has no rales.       Slight exp wheeze LUL, resolved with deep breathing    Musculoskeletal: Normal range of motion. She exhibits no edema.  Lymphadenopathy:    She has no cervical adenopathy.  Skin: Skin is warm and dry. No rash noted.  Psychiatric: She has a normal mood and affect.       Assessment & Plan:

## 2011-01-31 NOTE — Assessment & Plan Note (Signed)
5d hx, likely viral. Supportive care .  Start flonase.  Update [place] if going on past 10 days or any other concerns, fever >101, worsening cough, etc. To call us if any sob or worsening wheezing to consider alb inh.

## 2011-02-01 ENCOUNTER — Ambulatory Visit: Payer: BC Managed Care – PPO | Admitting: Family Medicine

## 2012-07-06 ENCOUNTER — Other Ambulatory Visit: Payer: Self-pay

## 2012-07-06 DIAGNOSIS — Z1231 Encounter for screening mammogram for malignant neoplasm of breast: Secondary | ICD-10-CM

## 2012-07-09 ENCOUNTER — Encounter: Payer: Self-pay | Admitting: *Deleted

## 2012-07-09 ENCOUNTER — Ambulatory Visit
Admission: RE | Admit: 2012-07-09 | Discharge: 2012-07-09 | Disposition: A | Discharge status: Home / Self Care | Payer: BC Managed Care – PPO | Source: Ambulatory Visit

## 2012-07-09 DIAGNOSIS — Z1231 Encounter for screening mammogram for malignant neoplasm of breast: Secondary | ICD-10-CM

## 2013-11-08 ENCOUNTER — Other Ambulatory Visit: Payer: Self-pay

## 2013-11-08 DIAGNOSIS — Z1231 Encounter for screening mammogram for malignant neoplasm of breast: Secondary | ICD-10-CM

## 2013-11-18 DIAGNOSIS — M1710 Unilateral primary osteoarthritis, unspecified knee: Secondary | ICD-10-CM | POA: Diagnosis not present

## 2013-11-19 ENCOUNTER — Encounter (INDEPENDENT_AMBULATORY_CARE_PROVIDER_SITE_OTHER): Payer: Self-pay

## 2013-11-19 ENCOUNTER — Ambulatory Visit
Admission: RE | Admit: 2013-11-19 | Discharge: 2013-11-19 | Disposition: A | Discharge status: Home / Self Care | Payer: Medicare Other | Source: Ambulatory Visit

## 2013-11-19 DIAGNOSIS — Z1231 Encounter for screening mammogram for malignant neoplasm of breast: Secondary | ICD-10-CM

## 2013-11-22 ENCOUNTER — Encounter: Payer: Self-pay | Admitting: *Deleted

## 2013-12-06 ENCOUNTER — Encounter: Payer: Self-pay | Admitting: Family Medicine

## 2013-12-06 ENCOUNTER — Ambulatory Visit (INDEPENDENT_AMBULATORY_CARE_PROVIDER_SITE_OTHER): Payer: Medicare Other | Admitting: Family Medicine

## 2013-12-06 VITALS — BP 146/86 | HR 72 | Temp 98.0°F | Ht 63.0 in | Wt 272.8 lb

## 2013-12-06 DIAGNOSIS — R030 Elevated blood-pressure reading, without diagnosis of hypertension: Secondary | ICD-10-CM | POA: Diagnosis not present

## 2013-12-06 DIAGNOSIS — Z01810 Encounter for preprocedural cardiovascular examination: Secondary | ICD-10-CM | POA: Diagnosis not present

## 2013-12-06 DIAGNOSIS — IMO0001 Reserved for inherently not codable concepts without codable children: Secondary | ICD-10-CM

## 2013-12-06 DIAGNOSIS — Z862 Personal history of diseases of the blood and blood-forming organs and certain disorders involving the immune mechanism: Secondary | ICD-10-CM | POA: Insufficient documentation

## 2013-12-06 LAB — CBC WITH DIFFERENTIAL/PLATELET
Basophils Absolute: 0.1 10*3/uL (ref 0.0–0.1)
Basophils Relative: 0.7 % (ref 0.0–3.0)
Eosinophils Absolute: 0.5 10*3/uL (ref 0.0–0.7)
Eosinophils Relative: 4.9 % (ref 0.0–5.0)
HCT: 39.6 % (ref 36.0–46.0)
Hemoglobin: 13 g/dL (ref 12.0–15.0)
Lymphocytes Relative: 25.1 % (ref 12.0–46.0)
Lymphs Abs: 2.5 10*3/uL (ref 0.7–4.0)
MCHC: 32.8 g/dL (ref 30.0–36.0)
MCV: 87.6 fl (ref 78.0–100.0)
Monocytes Absolute: 0.8 10*3/uL (ref 0.1–1.0)
Monocytes Relative: 7.7 % (ref 3.0–12.0)
Neutro Abs: 6.1 10*3/uL (ref 1.4–7.7)
Neutrophils Relative %: 61.6 % (ref 43.0–77.0)
Platelets: 303 10*3/uL (ref 150.0–400.0)
RBC: 4.52 Mil/uL (ref 3.87–5.11)
RDW: 14.3 % (ref 11.5–15.5)
WBC: 9.9 10*3/uL (ref 4.0–10.5)

## 2013-12-06 LAB — COMPREHENSIVE METABOLIC PANEL
ALT: 9 U/L (ref 0–35)
AST: 12 U/L (ref 0–37)
Albumin: 3.7 g/dL (ref 3.5–5.2)
Alkaline Phosphatase: 112 U/L (ref 39–117)
BUN: 16 mg/dL (ref 6–23)
CO2: 29 mEq/L (ref 19–32)
Calcium: 9.2 mg/dL (ref 8.4–10.5)
Chloride: 104 mEq/L (ref 96–112)
Creatinine, Ser: 0.7 mg/dL (ref 0.4–1.2)
GFR: 89.2 mL/min (ref >60.00)
Glucose, Bld: 87 mg/dL (ref 70–99)
Potassium: 4.6 mEq/L (ref 3.5–5.1)
Sodium: 140 mEq/L (ref 135–145)
Total Bilirubin: 1 mg/dL (ref 0.2–1.2)
Total Protein: 7.3 g/dL (ref 6.0–8.3)

## 2013-12-06 NOTE — Progress Notes (Signed)
Subjective:    Patient ID: [name], female    DOB: 1950-11-10, 63 y.o.   MRN: [id number]  HPI Here for pre op examination - sees Dr Ranell Patrick   To have TKA med and lat with patella resurfacing (right knee) She has had one before -knows what to expect   First time -she had PE afterwards and was on coumadin for 6 mo (from DVT)  She also had adhesions and had to have f/u procedure for that  Her knee is great now   Her orthopedic will put her on prophylactic coumadin and get her up sooner   Right now -she is limited - R knee -cannot do much Is OA   Wt is up 6 lb  bmi is 6 lb from last visit (not here since 2011) Stress eater / cannot exercise due to knee Daughter had a bad divorce and battled alcoholism , lost custody of children  Has not had health mt in a long time  Has "neglected herself" in the past 5 years  Time to take care of herself   Declines flu vaccine  Td -w/in 10 years -thinks she is up to date    Lab Results  Component Value Date   WBC 13.6* 12/05/2009   HGB 9.2* 12/05/2009   HCT 28.2* 12/05/2009   MCV 85.7 12/05/2009   PLT 262 12/05/2009     Chemistry      Component Value Date/Time   NA 138 12/04/2009 0505   K 3.4* 12/04/2009 0505   CL 108 12/04/2009 0505   CO2 25 12/04/2009 0505   BUN 5* 12/04/2009 0505   CREATININE 0.59 12/04/2009 0505      Component Value Date/Time   CALCIUM 8.1* 12/04/2009 0505   ALKPHOS 116 10/17/2009 1010   AST 14 10/17/2009 1010   ALT 13 10/17/2009 1010   BILITOT 0.7 10/17/2009 1010        bp is up on first check today She takes aleve for knee pain as needed - will stop before surgery  BP Readings from Last 3 Encounters:  12/06/13 146/86  01/31/11 136/78  10/17/09 136/88   138/85 on re check at rest today  EKG today NSR with rate of 72   No hx of cardiac problems  Has hx of spring time allergies/nasal  No asthma (has only wheezed with uris) Non smoker  Never had problems with anesthesia in the past   Patient Active Problem  List   Diagnosis Date Noted  . Sinusitis 01/31/2011  . PULMONARY EMBOLISM 01/02/2010  . WHEEZING 06/20/2009  . DRY SKIN 06/01/2007   Past Medical History  Diagnosis Date  . Osteoarthritis     knee  . Allergic rhinitis   . History of DVT (deep vein thrombosis)   . History of pulmonary embolus (PE)   . Family history of diabetes mellitus   . Other specified congenital anomaly of skin    Past Surgical History  Procedure Laterality Date  . Knee arthroscopy      x 3--bilateral  . Tubal ligation    . Uterine biopsy      RE: fibroid tumors  . Total knee arthroplasty  9/11    coumadin x 6 months   History  Substance Use Topics  . Smoking status: Never Smoker   . Smokeless tobacco: Not on file  . Alcohol Use: Yes     Comment: Rare   Family History  Problem Relation Age of Onset  . Cervical cancer Mother   .  Diabetes type II Mother   . Hiatal hernia Mother   . Coronary artery disease Father     CABG  . Diabetes Father   . Cancer Father     bladder  . Arrhythmia Father   . Gout Father   . Osteoarthritis Paternal Grandmother   . Cancer Paternal Grandfather   . Diabetes Paternal Grandfather   . Parkinsonism Maternal Grandmother   . Other Maternal Grandfather     "fluid in his lungs"   Allergies  Allergen Reactions  . Penicillins     REACTION: swelling   Current Outpatient Prescriptions on File Prior to Visit  Medication Sig Dispense Refill  . naproxen sodium (ANAPROX) 220 MG tablet Take 220 mg by mouth as needed.         No current facility-administered medications on file prior to visit.    Review of Systems Review of Systems  Constitutional: Negative for fever, appetite change,  and unexpected weight change.  ENT neg for ST or snoring  Eyes: Negative for pain and visual disturbance.  Respiratory: Negative for cough and shortness of breath.   Cardiovascular: Negative for cp or palpitations    Gastrointestinal: Negative for nausea, diarrhea and constipation.    Genitourinary: Negative for urgency and frequency.  Skin: Negative for pallor or rash  MSK pos for R knee pain   Neurological: Negative for weakness, light-headedness, numbness and headaches.  Hematological: Negative for adenopathy. Does not bruise/bleed easily.  Psychiatric/Behavioral: Negative for dysphoric mood. The patient is not nervous/anxious.         Objective:   Physical Exam  Constitutional: She appears well-developed and well-nourished. No distress.  obese and well appearing   HENT:  Head: Normocephalic and atraumatic.  Right Ear: External ear normal.  Left Ear: External ear normal.  Nose: Nose normal.  Mouth/Throat: Oropharynx is clear and moist.  Eyes: Conjunctivae and EOM are normal. Pupils are equal, round, and reactive to light. Right eye exhibits no discharge. Left eye exhibits no discharge. No scleral icterus.  Neck: Normal range of motion. Neck supple. No JVD present. No thyromegaly present.  Cardiovascular: Normal rate, regular rhythm, normal heart sounds and intact distal pulses.  Exam reveals no gallop.   Pulmonary/Chest: Effort normal and breath sounds normal. No respiratory distress. She has no wheezes. She has no rales.  Abdominal: Soft. Bowel sounds are normal. She exhibits no distension and no mass. There is no tenderness.  Musculoskeletal: She exhibits tenderness. She exhibits no edema.  R knee has limited rom   Lymphadenopathy:    She has no cervical adenopathy.  Neurological: She is alert. She has normal reflexes. No cranial nerve deficit. She exhibits normal muscle tone. Coordination normal.  Skin: Skin is warm and dry. No rash noted. No erythema. No pallor.  Psychiatric: She has a normal mood and affect.          Assessment & Plan:   Problem List Items Addressed This Visit     Other   Pre-operative cardiovascular examination - Primary     Pt is obese No cardio/ pulm prev dx except for prev PE (her orthopedic Dr is aware and will anticoag  more aggressively) Re check chem prof and cbc today  No hx of anesthesia problems  EKG is unremarkable   Risk factors for surgery include obesity and prior PE   Will complete paperwork for medical clearance     Relevant Orders      EKG 12-Lead (Completed)   History of anemia  Last cbc we have was after last knee surgery  Need to make sure this is improved for upcoming knee surgery  She has fatigue at times  Cbc today    Relevant Orders      CBC with Differential (Completed)   Elevated BP     Better on re check Disc need for weight loss and better exercise habits (when able) Reviewed DASH diet  Will re check at health mt exam    Relevant Orders      Comprehensive metabolic panel (Completed)

## 2013-12-06 NOTE — Assessment & Plan Note (Addendum)
Last cbc we have was after last knee surgery  Need to make sure this is improved for upcoming knee surgery  She has fatigue at times  Cbc today

## 2013-12-06 NOTE — Assessment & Plan Note (Signed)
Better on re check Disc need for weight loss and better exercise habits (when able) Reviewed DASH diet  Will re check at health mt exam

## 2013-12-06 NOTE — Patient Instructions (Signed)
Blood pressure is better on 2nd check  When able -exercise will help Drink lots of water and watch sodium in foods  Look at the literature on DASH diet  No restrictions for surgery   Labs today Set up a physical for late winter

## 2013-12-06 NOTE — Progress Notes (Signed)
Pre visit review using our clinic review tool, if applicable. No additional management support is needed unless otherwise documented below in the visit note. 

## 2013-12-06 NOTE — Assessment & Plan Note (Signed)
Pt is obese No cardio/ pulm prev dx except for prev PE (her orthopedic Dr is aware and will anticoag more aggressively) Re check chem prof and cbc today  No hx of anesthesia problems  EKG is unremarkable   Risk factors for surgery include obesity and prior PE   Will complete paperwork for medical clearance

## 2013-12-07 ENCOUNTER — Encounter: Payer: Self-pay | Admitting: *Deleted

## 2013-12-16 DIAGNOSIS — M1710 Unilateral primary osteoarthritis, unspecified knee: Secondary | ICD-10-CM | POA: Diagnosis not present

## 2013-12-31 DIAGNOSIS — H35359 Cystoid macular degeneration, unspecified eye: Secondary | ICD-10-CM | POA: Diagnosis not present

## 2014-01-21 ENCOUNTER — Encounter (HOSPITAL_COMMUNITY): Payer: Self-pay | Admitting: Pharmacy Technician

## 2014-01-26 ENCOUNTER — Encounter (HOSPITAL_COMMUNITY): Payer: Self-pay

## 2014-01-26 ENCOUNTER — Encounter (HOSPITAL_COMMUNITY)
Admission: RE | Admit: 2014-01-26 | Discharge: 2014-01-26 | Disposition: A | Discharge status: Home / Self Care | Payer: Medicare Other | Source: Ambulatory Visit | Attending: Orthopedic Surgery | Admitting: Orthopedic Surgery

## 2014-01-26 ENCOUNTER — Encounter (HOSPITAL_COMMUNITY)
Admission: RE | Admit: 2014-01-26 | Discharge: 2014-01-26 | Disposition: A | Discharge status: Home / Self Care | Payer: Medicare Other | Source: Ambulatory Visit | Attending: Anesthesiology | Admitting: Anesthesiology

## 2014-01-26 DIAGNOSIS — Z86718 Personal history of other venous thrombosis and embolism: Secondary | ICD-10-CM | POA: Insufficient documentation

## 2014-01-26 DIAGNOSIS — Z01818 Encounter for other preprocedural examination: Secondary | ICD-10-CM | POA: Diagnosis not present

## 2014-01-26 DIAGNOSIS — I739 Peripheral vascular disease, unspecified: Secondary | ICD-10-CM | POA: Insufficient documentation

## 2014-01-26 DIAGNOSIS — Z0000 Encounter for general adult medical examination without abnormal findings: Secondary | ICD-10-CM | POA: Insufficient documentation

## 2014-01-26 DIAGNOSIS — I517 Cardiomegaly: Secondary | ICD-10-CM | POA: Insufficient documentation

## 2014-01-26 DIAGNOSIS — I10 Essential (primary) hypertension: Secondary | ICD-10-CM | POA: Diagnosis not present

## 2014-01-26 DIAGNOSIS — M1711 Unilateral primary osteoarthritis, right knee: Secondary | ICD-10-CM | POA: Insufficient documentation

## 2014-01-26 HISTORY — DX: Peripheral vascular disease, unspecified: I73.9

## 2014-01-26 LAB — BASIC METABOLIC PANEL
Anion gap: 14 (ref 5–15)
BUN: 11 mg/dL (ref 6–23)
CO2: 24 mEq/L (ref 19–32)
Calcium: 9.3 mg/dL (ref 8.4–10.5)
Chloride: 103 mEq/L (ref 96–112)
Creatinine, Ser: 0.66 mg/dL (ref 0.50–1.10)
GFR calc Af Amer: >90 mL/min (ref >90)
GFR calc non Af Amer: >90 mL/min (ref >90)
Glucose, Bld: 96 mg/dL (ref 70–99)
Potassium: 4.2 mEq/L (ref 3.7–5.3)
Sodium: 141 mEq/L (ref 137–147)

## 2014-01-26 LAB — TYPE AND SCREEN
ABO/RH(D): A NEG
Antibody Screen: NEGATIVE

## 2014-01-26 LAB — CBC
HCT: 40.9 % (ref 36.0–46.0)
Hemoglobin: 13.3 g/dL (ref 12.0–15.0)
MCH: 29.1 pg (ref 26.0–34.0)
MCHC: 32.5 g/dL (ref 30.0–36.0)
MCV: 89.5 fL (ref 78.0–100.0)
Platelets: 339 10*3/uL (ref 150–400)
RBC: 4.57 MIL/uL (ref 3.87–5.11)
RDW: 13.9 % (ref 11.5–15.5)
WBC: 9.8 10*3/uL (ref 4.0–10.5)

## 2014-01-26 LAB — SURGICAL PCR SCREEN
MRSA, PCR: NEGATIVE
Staphylococcus aureus: POSITIVE — AB

## 2014-01-26 LAB — PROTIME-INR
INR: 0.97 (ref 0.00–1.49)
Prothrombin Time: 13 seconds (ref 11.6–15.2)

## 2014-01-26 LAB — APTT: aPTT: 28 seconds (ref 24–37)

## 2014-01-26 MED ORDER — CHLORHEXIDINE GLUCONATE 4 % EX LIQD: 60.0000 mL | Freq: Once | CUTANEOUS | Status: DC

## 2014-01-26 NOTE — Progress Notes (Signed)
Patient made aware that nasal swab tested positive for staph,script was called to [facility] at [phone]. Patient verbalized instructions.

## 2014-01-26 NOTE — Pre-Procedure Instructions (Signed)
Jeral PinchJudy P Chiasson  01/26/2014   Your procedure is scheduled on:  Friday February 04, 2014 at 0730 AM  [facility] at 0530 AM.  Call this number if you have problems the morning of surgery: [phone]   Remember:   Do not eat food or drink liquids after midnight Thursday   Take these medicines the morning of surgery with A SIP OF WATER: NONE   Do not wear jewelry, make-up or nail polish.  Do not wear lotions, powders, or perfumes. You may wear deodorant.  Do not shave 48 hours prior to surgery.   Do not bring valuables to the hospital.  [facility] is not responsible  for any belongings or valuables.               Contacts, dentures or bridgework may not be worn into surgery.  Leave suitcase in the car. After surgery it may be brought to your room.  For patients admitted to the hospital, discharge time is determined by your  treatment team.               Patients discharged the day of surgery will not be allowed to drive home.   Special Instructions: [facility] - Preparing for Surgery  Before surgery, you can play an important role.  Because skin is not sterile, your skin needs to be as free of germs as possible.  You can reduce the number of germs on you skin by washing with CHG (chlorahexidine gluconate) soap before surgery.  CHG is an antiseptic cleaner which kills germs and bonds with the skin to continue killing germs even after washing.  Please DO NOT use if you have an allergy to CHG or antibacterial soaps.  If your skin becomes reddened/irritated stop using the CHG and inform your nurse when you arrive at Short Stay.  Do not shave (including legs and underarms) for at least 48 hours prior to the first CHG shower.  You may shave your face.  Please follow these instructions carefully:   1.  Shower with CHG Soap the night before surgery and the                                morning of Surgery.  2.  If you choose to wash your hair, wash your hair  first as usual with your       normal shampoo.  3.  After you shampoo, rinse your hair and body thoroughly to remove the                      Shampoo.  4.  Use CHG as you would any other liquid soap.  You can apply chg directly       to the skin and wash gently with scrungie or a clean washcloth.  5.  Apply the CHG Soap to your body ONLY FROM THE NECK DOWN.        Do not use on open wounds or open sores.  Avoid contact with your eyes,       ears, mouth and genitals (private parts).  Wash genitals (private parts)       with your normal soap.  6.  Wash thoroughly, paying special attention to the area where your surgery        will be performed.  7.  Thoroughly rinse your body with warm water from the neck  down.  8.  DO NOT shower/wash with your normal soap after using and rinsing off       the CHG Soap.  9.  Pat yourself dry with a clean towel.            10.  Wear clean pajamas.            11.  Place clean sheets on your bed the night of your first shower and do not        sleep with pets.  Day of Surgery  Do not apply any lotions/deoderants the morning of surgery.  Please wear clean clothes to the hospital/surgery center.      Please read over the following fact sheets that you were given: Pain Booklet, Coughing and Deep Breathing, Blood Transfusion Information, MRSA Information and Surgical Site Infection Prevention

## 2014-01-26 NOTE — H&P (Signed)
  [name] is an 63 y.o. female.    Chief Complaint: right knee  HPI: Pt is a 63 y.o. female complaining of right knee pain for multiple years. Pain had continually increased since the beginning. X-rays in the clinic show end-stage arthritic changes of the right knee. Pt has tried various conservative treatments which have failed to alleviate their symptoms, including therapy and injections. Various options are discussed with the patient. Risks, benefits and expectations were discussed with the patient. Patient understand the risks, benefits and expectations and wishes to proceed with surgery.   PCP:  Roxy MannsMarne Tower, MD  D/C Plans:  Home with HHPT  PMH: Past Medical History  Diagnosis Date  . Osteoarthritis     knee  . Allergic rhinitis   . History of DVT (deep vein thrombosis)   . History of pulmonary embolus (PE)   . Family history of diabetes mellitus   . Other specified congenital anomaly of skin     PSH: Past Surgical History  Procedure Laterality Date  . Knee arthroscopy      x 3--bilateral  . Tubal ligation    . Uterine biopsy      RE: fibroid tumors  . Total knee arthroplasty  9/11    coumadin x 6 months    Social History:  reports that she has never smoked. She does not have any smokeless tobacco history on file. She reports that she drinks alcohol. She reports that she does not use illicit drugs.  Allergies:  Allergies  Allergen Reactions  . Penicillins Swelling    Medications: No current facility-administered medications for this encounter.   Current Outpatient Prescriptions  Medication Sig Dispense Refill  . naproxen sodium (ANAPROX) 220 MG tablet Take 220 mg by mouth daily as needed (for pain).         No results found for this or any previous visit (from the past 48 hour(s)). No results found.  ROS: Pain with rom of the right lower extremity  Physical Exam:  Alert and oriented 63 y.o. female in no acute distress Cranial nerves 2-12  intact Cervical spine: full rom with no tenderness, nv intact distally Chest: active breath sounds bilaterally, no wheeze rhonchi or rales Heart: regular rate and rhythm, no murmur Abd: non tender non distended with active bowel sounds Hip is stable with rom  Right knee with moderate crepitus and pain with rom nv intact distally Quad avoidance and antalgic gait No rashes   Assessment/Plan Assessment: right knee end stage osteoarthritis    Plan: Patient will undergo a right total knee arthroplasty by Dr. Ranell PatrickNorris at [facility]. Risks benefits and expectations were discussed with the patient. Patient understand risks, benefits and expectations and wishes to proceed.

## 2014-01-27 ENCOUNTER — Encounter (HOSPITAL_COMMUNITY): Payer: Self-pay

## 2014-01-27 NOTE — Progress Notes (Signed)
Anesthesia Chart Review:  Patient is a 63 year old female scheduled for right TKA on 02/04/14 by Dr. Ranell PatrickNorris.  History includes non-smoker, PVD, DVT, post-operative LLL PE 11/2009, osteoarthritis, tonsillectomy, left TKA '11. BMI is consistent with morbid obesity.  PCP is Dr. Roxy MannsMarne Tower who medically cleared patient for surgery with special attention to post-operative PE history.    EKG on 12/06/13 showed NSR.   CXR on 01/26/14 showed:  1. No active cardiopulmonary disease.  2. Stable cardiomegaly.   Preoperative labs noted.    If no acute changes then I anticipate that she can proceed as planned.  Velna Ochsllison Fatima Fedie, PA-C [facility]/Anesthesiology Phone [phone] 01/27/2014 10:28 AM

## 2014-02-03 MED ORDER — CLINDAMYCIN PHOSPHATE 900 MG/50ML IV SOLN
900.0000 mg | INTRAVENOUS | Status: AC
  Administered 2014-02-04: 900 mg via INTRAVENOUS
  Filled 2014-02-03: qty 50

## 2014-02-04 ENCOUNTER — Encounter (HOSPITAL_COMMUNITY)
Admission: RE | Disposition: A | Discharge status: Home / Self Care | Payer: Self-pay | Source: Ambulatory Visit | Attending: Orthopedic Surgery

## 2014-02-04 ENCOUNTER — Inpatient Hospital Stay (HOSPITAL_COMMUNITY): Payer: Medicare Other

## 2014-02-04 ENCOUNTER — Inpatient Hospital Stay (HOSPITAL_COMMUNITY): Payer: Medicare Other | Admitting: Certified Registered Nurse Anesthetist

## 2014-02-04 ENCOUNTER — Encounter (HOSPITAL_COMMUNITY): Payer: Medicare Other | Admitting: Vascular Surgery

## 2014-02-04 ENCOUNTER — Encounter (HOSPITAL_COMMUNITY): Payer: Self-pay | Admitting: *Deleted

## 2014-02-04 ENCOUNTER — Inpatient Hospital Stay (HOSPITAL_COMMUNITY)
Admission: RE | Admit: 2014-02-04 | Discharge: 2014-02-07 | DRG: 470 | Disposition: A | Discharge status: Home / Self Care | Payer: Medicare Other | Source: Ambulatory Visit | Attending: Orthopedic Surgery | Admitting: Orthopedic Surgery

## 2014-02-04 DIAGNOSIS — G8918 Other acute postprocedural pain: Secondary | ICD-10-CM | POA: Diagnosis not present

## 2014-02-04 DIAGNOSIS — M179 Osteoarthritis of knee, unspecified: Principal | ICD-10-CM | POA: Diagnosis present

## 2014-02-04 DIAGNOSIS — Z880 Allergy status to penicillin: Secondary | ICD-10-CM

## 2014-02-04 DIAGNOSIS — Z471 Aftercare following joint replacement surgery: Secondary | ICD-10-CM | POA: Diagnosis not present

## 2014-02-04 DIAGNOSIS — M1711 Unilateral primary osteoarthritis, right knee: Secondary | ICD-10-CM | POA: Diagnosis present

## 2014-02-04 DIAGNOSIS — Z96651 Presence of right artificial knee joint: Secondary | ICD-10-CM | POA: Diagnosis not present

## 2014-02-04 DIAGNOSIS — Z86711 Personal history of pulmonary embolism: Secondary | ICD-10-CM

## 2014-02-04 DIAGNOSIS — Z79899 Other long term (current) drug therapy: Secondary | ICD-10-CM

## 2014-02-04 DIAGNOSIS — M25561 Pain in right knee: Secondary | ICD-10-CM | POA: Diagnosis not present

## 2014-02-04 DIAGNOSIS — Z86718 Personal history of other venous thrombosis and embolism: Secondary | ICD-10-CM | POA: Diagnosis not present

## 2014-02-04 DIAGNOSIS — D72829 Elevated white blood cell count, unspecified: Secondary | ICD-10-CM | POA: Diagnosis present

## 2014-02-04 HISTORY — PX: TOTAL KNEE ARTHROPLASTY: SHX125

## 2014-02-04 HISTORY — DX: Frequency of micturition: R35.0

## 2014-02-04 LAB — CBC
HCT: 34.6 % — ABNORMAL LOW (ref 36.0–46.0)
Hemoglobin: 11.2 g/dL — ABNORMAL LOW (ref 12.0–15.0)
MCH: 28.6 pg (ref 26.0–34.0)
MCHC: 32.4 g/dL (ref 30.0–36.0)
MCV: 88.3 fL (ref 78.0–100.0)
Platelets: 285 10*3/uL (ref 150–400)
RBC: 3.92 MIL/uL (ref 3.87–5.11)
RDW: 14.5 % (ref 11.5–15.5)
WBC: 15.8 10*3/uL — ABNORMAL HIGH (ref 4.0–10.5)

## 2014-02-04 LAB — CREATININE, SERUM
Creatinine, Ser: 0.83 mg/dL (ref 0.50–1.10)
GFR calc Af Amer: 84 mL/min — ABNORMAL LOW (ref >90)
GFR calc non Af Amer: 72 mL/min — ABNORMAL LOW (ref >90)

## 2014-02-04 SURGERY — ARTHROPLASTY, KNEE, TOTAL
Anesthesia: Regional | Site: Knee | Laterality: Right

## 2014-02-04 MED ORDER — FERROUS SULFATE 325 (65 FE) MG PO TABS
325.0000 mg | ORAL_TABLET | Freq: Three times a day (TID) | ORAL | Status: DC
  Administered 2014-02-04 – 2014-02-07 (×7): 325 mg via ORAL
  Filled 2014-02-04 (×11): qty 1

## 2014-02-04 MED ORDER — MIDAZOLAM HCL 5 MG/5ML IJ SOLN
INTRAMUSCULAR | Status: DC | PRN
  Administered 2014-02-04: 2 mg via INTRAVENOUS

## 2014-02-04 MED ORDER — ROCURONIUM BROMIDE 100 MG/10ML IV SOLN
INTRAVENOUS | Status: DC | PRN
  Administered 2014-02-04: 40 mg via INTRAVENOUS

## 2014-02-04 MED ORDER — LACTATED RINGERS IV SOLN
INTRAVENOUS | Status: DC | PRN
  Administered 2014-02-04: 07:00:00 via INTRAVENOUS

## 2014-02-04 MED ORDER — PHENOL 1.4 % MT LIQD: 1.0000 | OROMUCOSAL | Status: DC | PRN

## 2014-02-04 MED ORDER — GLYCOPYRROLATE 0.2 MG/ML IJ SOLN
INTRAMUSCULAR | Status: DC | PRN
  Administered 2014-02-04: .8 mg via INTRAVENOUS

## 2014-02-04 MED ORDER — WARFARIN SODIUM 5 MG PO TABS
5.0000 mg | ORAL_TABLET | Freq: Once | ORAL | Status: AC
  Administered 2014-02-04: 5 mg via ORAL
  Filled 2014-02-04: qty 1

## 2014-02-04 MED ORDER — PHENYLEPHRINE 40 MCG/ML (10ML) SYRINGE FOR IV PUSH (FOR BLOOD PRESSURE SUPPORT)
PREFILLED_SYRINGE | INTRAVENOUS | Status: AC
  Filled 2014-02-04: qty 10

## 2014-02-04 MED ORDER — FENTANYL CITRATE 0.05 MG/ML IJ SOLN
INTRAMUSCULAR | Status: AC
  Filled 2014-02-04: qty 5

## 2014-02-04 MED ORDER — METOCLOPRAMIDE HCL 5 MG/ML IJ SOLN
INTRAMUSCULAR | Status: AC
  Filled 2014-02-04: qty 2

## 2014-02-04 MED ORDER — POLYETHYLENE GLYCOL 3350 17 G PO PACK: 17.0000 g | PACK | Freq: Every day | ORAL | Status: DC | PRN

## 2014-02-04 MED ORDER — METHOCARBAMOL 500 MG PO TABS
500.0000 mg | ORAL_TABLET | Freq: Four times a day (QID) | ORAL | Status: DC | PRN
  Administered 2014-02-04 – 2014-02-07 (×5): 500 mg via ORAL
  Filled 2014-02-04 (×5): qty 1

## 2014-02-04 MED ORDER — HYDROMORPHONE HCL 1 MG/ML IJ SOLN
INTRAMUSCULAR | Status: AC
  Filled 2014-02-04: qty 2

## 2014-02-04 MED ORDER — METOCLOPRAMIDE HCL 5 MG PO TABS
5.0000 mg | ORAL_TABLET | Freq: Three times a day (TID) | ORAL | Status: DC | PRN
  Filled 2014-02-04: qty 2

## 2014-02-04 MED ORDER — HYDROMORPHONE HCL 1 MG/ML IJ SOLN
0.2500 mg | INTRAMUSCULAR | Status: DC | PRN
  Administered 2014-02-04 (×2): 0.5 mg via INTRAVENOUS
  Administered 2014-02-04: 1 mg via INTRAVENOUS

## 2014-02-04 MED ORDER — COUMADIN BOOK
Freq: Once | Status: AC
  Administered 2014-02-04: 16:00:00
  Filled 2014-02-04: qty 1

## 2014-02-04 MED ORDER — LIDOCAINE HCL (CARDIAC) 20 MG/ML IV SOLN
INTRAVENOUS | Status: DC | PRN
  Administered 2014-02-04: 70 mg via INTRAVENOUS

## 2014-02-04 MED ORDER — NEOSTIGMINE METHYLSULFATE 10 MG/10ML IV SOLN
INTRAVENOUS | Status: DC | PRN
  Administered 2014-02-04: 5 mg via INTRAVENOUS

## 2014-02-04 MED ORDER — HYDROMORPHONE HCL 1 MG/ML IJ SOLN
1.0000 mg | INTRAMUSCULAR | Status: DC | PRN
Start: 2014-02-04 — End: 2014-02-07

## 2014-02-04 MED ORDER — PROPOFOL 10 MG/ML IV BOLUS
INTRAVENOUS | Status: AC
  Filled 2014-02-04: qty 20

## 2014-02-04 MED ORDER — ONDANSETRON HCL 4 MG PO TABS
4.0000 mg | ORAL_TABLET | Freq: Four times a day (QID) | ORAL | Status: DC | PRN
Start: 2014-02-04 — End: 2014-02-07
  Administered 2014-02-05 – 2014-02-07 (×2): 4 mg via ORAL
  Filled 2014-02-04 (×2): qty 1

## 2014-02-04 MED ORDER — ACETAMINOPHEN 650 MG RE SUPP: 650.0000 mg | Freq: Four times a day (QID) | RECTAL | Status: DC | PRN

## 2014-02-04 MED ORDER — MIDAZOLAM HCL 2 MG/2ML IJ SOLN
INTRAMUSCULAR | Status: AC
  Filled 2014-02-04: qty 2

## 2014-02-04 MED ORDER — SODIUM CHLORIDE 0.9 % IR SOLN
Status: DC | PRN
  Administered 2014-02-04: 1000 mL

## 2014-02-04 MED ORDER — OXYCODONE-ACETAMINOPHEN 5-325 MG PO TABS: 1.0000 | ORAL_TABLET | ORAL | Status: DC | PRN

## 2014-02-04 MED ORDER — ONDANSETRON HCL 4 MG/2ML IJ SOLN
INTRAMUSCULAR | Status: AC
  Filled 2014-02-04: qty 2

## 2014-02-04 MED ORDER — WARFARIN SODIUM 5 MG PO TABS: 5.0000 mg | ORAL_TABLET | Freq: Every day | ORAL | Status: DC

## 2014-02-04 MED ORDER — ONDANSETRON HCL 4 MG/2ML IJ SOLN
4.0000 mg | Freq: Four times a day (QID) | INTRAMUSCULAR | Status: DC | PRN
  Administered 2014-02-06: 4 mg via INTRAVENOUS
  Filled 2014-02-04: qty 2

## 2014-02-04 MED ORDER — ACETAMINOPHEN 325 MG PO TABS: 650.0000 mg | ORAL_TABLET | Freq: Four times a day (QID) | ORAL | Status: DC | PRN

## 2014-02-04 MED ORDER — OXYCODONE HCL 5 MG PO TABS
ORAL_TABLET | ORAL | Status: AC
  Filled 2014-02-04: qty 2

## 2014-02-04 MED ORDER — SODIUM CHLORIDE 0.9 % IV SOLN
INTRAVENOUS | Status: DC
  Administered 2014-02-04: 19:00:00 via INTRAVENOUS

## 2014-02-04 MED ORDER — ONDANSETRON HCL 4 MG/2ML IJ SOLN
INTRAMUSCULAR | Status: DC | PRN
  Administered 2014-02-04: 4 mg via INTRAVENOUS

## 2014-02-04 MED ORDER — WARFARIN VIDEO: Freq: Once | Status: DC

## 2014-02-04 MED ORDER — OXYCODONE HCL 5 MG PO TABS
5.0000 mg | ORAL_TABLET | ORAL | Status: DC | PRN
  Administered 2014-02-04 – 2014-02-07 (×10): 10 mg via ORAL
  Filled 2014-02-04 (×9): qty 2

## 2014-02-04 MED ORDER — PROPOFOL 10 MG/ML IV BOLUS
INTRAVENOUS | Status: DC | PRN
  Administered 2014-02-04: 200 mg via INTRAVENOUS

## 2014-02-04 MED ORDER — METHOCARBAMOL 1000 MG/10ML IJ SOLN
500.0000 mg | Freq: Four times a day (QID) | INTRAVENOUS | Status: DC | PRN
  Filled 2014-02-04: qty 5

## 2014-02-04 MED ORDER — FENTANYL CITRATE 0.05 MG/ML IJ SOLN
INTRAMUSCULAR | Status: DC | PRN
  Administered 2014-02-04: 75 ug via INTRAVENOUS
  Administered 2014-02-04: 50 ug via INTRAVENOUS
  Administered 2014-02-04: 100 ug via INTRAVENOUS
  Administered 2014-02-04: 75 ug via INTRAVENOUS

## 2014-02-04 MED ORDER — ROCURONIUM BROMIDE 50 MG/5ML IV SOLN
INTRAVENOUS | Status: AC
  Filled 2014-02-04: qty 1

## 2014-02-04 MED ORDER — METHOCARBAMOL 500 MG PO TABS
ORAL_TABLET | ORAL | Status: AC
  Filled 2014-02-04: qty 1

## 2014-02-04 MED ORDER — LIDOCAINE HCL (CARDIAC) 20 MG/ML IV SOLN
INTRAVENOUS | Status: AC
  Filled 2014-02-04: qty 5

## 2014-02-04 MED ORDER — MENTHOL 3 MG MT LOZG: 1.0000 | LOZENGE | OROMUCOSAL | Status: DC | PRN

## 2014-02-04 MED ORDER — SUCCINYLCHOLINE CHLORIDE 20 MG/ML IJ SOLN
INTRAMUSCULAR | Status: AC
  Filled 2014-02-04: qty 1

## 2014-02-04 MED ORDER — SODIUM CHLORIDE 0.9 % IR SOLN
Status: DC | PRN
  Administered 2014-02-04: 3000 mL

## 2014-02-04 MED ORDER — METHOCARBAMOL 500 MG PO TABS: 500.0000 mg | ORAL_TABLET | Freq: Three times a day (TID) | ORAL | Status: DC | PRN

## 2014-02-04 MED ORDER — WARFARIN - PHARMACIST DOSING INPATIENT: Freq: Every day | Status: DC

## 2014-02-04 MED ORDER — ENOXAPARIN SODIUM 30 MG/0.3ML ~~LOC~~ SOLN
30.0000 mg | Freq: Two times a day (BID) | SUBCUTANEOUS | Status: DC
  Administered 2014-02-05 – 2014-02-07 (×5): 30 mg via SUBCUTANEOUS
  Filled 2014-02-04 (×7): qty 0.3

## 2014-02-04 MED ORDER — METOCLOPRAMIDE HCL 5 MG/ML IJ SOLN
5.0000 mg | Freq: Three times a day (TID) | INTRAMUSCULAR | Status: DC | PRN
  Administered 2014-02-04: 10 mg via INTRAVENOUS
  Filled 2014-02-04: qty 2

## 2014-02-04 MED ORDER — CLINDAMYCIN PHOSPHATE 600 MG/50ML IV SOLN
600.0000 mg | Freq: Four times a day (QID) | INTRAVENOUS | Status: AC
  Administered 2014-02-04 – 2014-02-05 (×2): 600 mg via INTRAVENOUS
  Filled 2014-02-04 (×3): qty 50

## 2014-02-04 SURGICAL SUPPLY — 60 items
BANDAGE ELASTIC 6 VELCRO ST LF (GAUZE/BANDAGES/DRESSINGS) ×2 IMPLANT
BANDAGE ESMARK 6X9 LF (GAUZE/BANDAGES/DRESSINGS) ×1 IMPLANT
BLADE SAG 18X100X1.27 (BLADE) ×3 IMPLANT
BLADE SAW SGTL 13.0X1.19X90.0M (BLADE) ×3 IMPLANT
BNDG CMPR 9X6 STRL LF SNTH (GAUZE/BANDAGES/DRESSINGS) ×1
BNDG CMPR MED 10X6 ELC LF (GAUZE/BANDAGES/DRESSINGS) ×1
BNDG ELASTIC 6X10 VLCR STRL LF (GAUZE/BANDAGES/DRESSINGS) ×3 IMPLANT
BNDG ESMARK 6X9 LF (GAUZE/BANDAGES/DRESSINGS) ×3
BNDG GAUZE ELAST 4 BULKY (GAUZE/BANDAGES/DRESSINGS) ×6 IMPLANT
BOWL SMART MIX CTS (DISPOSABLE) ×3 IMPLANT
CAP UPCHARGE REVISION TRAY ×2 IMPLANT
CAPT RP KNEE ×2 IMPLANT
CEMENT HV SMART SET (Cement) ×6 IMPLANT
CLOSURE WOUND 1/2 X4 (GAUZE/BANDAGES/DRESSINGS)
COVER SURGICAL LIGHT HANDLE (MISCELLANEOUS) ×3 IMPLANT
CUFF TOURNIQUET SINGLE 34IN LL (TOURNIQUET CUFF) IMPLANT
CUFF TOURNIQUET SINGLE 44IN (TOURNIQUET CUFF) ×2 IMPLANT
DRAPE EXTREMITY T 121X128X90 (DRAPE) ×3 IMPLANT
DRAPE INCISE IOBAN 66X45 STRL (DRAPES) ×2 IMPLANT
DRAPE PROXIMA HALF (DRAPES) ×3 IMPLANT
DRAPE U-SHAPE 47X51 STRL (DRAPES) ×3 IMPLANT
DRSG ADAPTIC 3X8 NADH LF (GAUZE/BANDAGES/DRESSINGS) ×3 IMPLANT
DRSG PAD ABDOMINAL 8X10 ST (GAUZE/BANDAGES/DRESSINGS) ×6 IMPLANT
DURAPREP 26ML APPLICATOR (WOUND CARE) ×3 IMPLANT
ELECT CAUTERY BLADE 6.4 (BLADE) ×3 IMPLANT
ELECT REM PT RETURN 9FT ADLT (ELECTROSURGICAL) ×3
ELECTRODE REM PT RTRN 9FT ADLT (ELECTROSURGICAL) ×1 IMPLANT
GAUZE SPONGE 4X4 12PLY STRL (GAUZE/BANDAGES/DRESSINGS) ×3 IMPLANT
GLOVE BIOGEL PI ORTHO PRO 7.5 (GLOVE) ×2
GLOVE BIOGEL PI ORTHO PRO SZ8 (GLOVE) ×2
GLOVE ORTHO TXT STRL SZ7.5 (GLOVE) ×3 IMPLANT
GLOVE PI ORTHO PRO STRL 7.5 (GLOVE) ×1 IMPLANT
GLOVE PI ORTHO PRO STRL SZ8 (GLOVE) ×1 IMPLANT
GLOVE SURG ORTHO 8.5 STRL (GLOVE) ×3 IMPLANT
GOWN STRL REUS W/ TWL XL LVL3 (GOWN DISPOSABLE) ×3 IMPLANT
GOWN STRL REUS W/TWL XL LVL3 (GOWN DISPOSABLE) ×6
HANDPIECE INTERPULSE COAX TIP (DISPOSABLE) ×3
IMMOBILIZER KNEE 22 UNIV (SOFTGOODS) ×2 IMPLANT
KIT BASIN OR (CUSTOM PROCEDURE TRAY) ×3 IMPLANT
KIT MANIFOLD (MISCELLANEOUS) ×3 IMPLANT
KIT ROOM TURNOVER OR (KITS) ×3 IMPLANT
MANIFOLD NEPTUNE II (INSTRUMENTS) ×3 IMPLANT
NS IRRIG 1000ML POUR BTL (IV SOLUTION) ×3 IMPLANT
PACK TOTAL JOINT (CUSTOM PROCEDURE TRAY) ×3 IMPLANT
PAD ARMBOARD 7.5X6 YLW CONV (MISCELLANEOUS) ×4 IMPLANT
SET HNDPC FAN SPRY TIP SCT (DISPOSABLE) ×1 IMPLANT
STAPLER VISISTAT 35W (STAPLE) ×2 IMPLANT
STRIP CLOSURE SKIN 1/2X4 (GAUZE/BANDAGES/DRESSINGS) ×2 IMPLANT
SUCTION FRAZIER TIP 10 FR DISP (SUCTIONS) ×3 IMPLANT
SUT MNCRL AB 3-0 PS2 18 (SUTURE) ×3 IMPLANT
SUT VIC AB 0 CT1 27 (SUTURE) ×6
SUT VIC AB 0 CT1 27XBRD ANBCTR (SUTURE) ×2 IMPLANT
SUT VIC AB 1 CT1 27 (SUTURE) ×9
SUT VIC AB 1 CT1 27XBRD ANBCTR (SUTURE) ×3 IMPLANT
SUT VIC AB 2-0 CT1 27 (SUTURE) ×6
SUT VIC AB 2-0 CT1 TAPERPNT 27 (SUTURE) ×2 IMPLANT
TOWEL OR 17X24 6PK STRL BLUE (TOWEL DISPOSABLE) ×3 IMPLANT
TOWEL OR 17X26 10 PK STRL BLUE (TOWEL DISPOSABLE) ×3 IMPLANT
TRAY FOLEY CATH 16FRSI W/METER (SET/KITS/TRAYS/PACK) ×2 IMPLANT
WATER STERILE IRR 1000ML POUR (IV SOLUTION) ×2 IMPLANT

## 2014-02-04 NOTE — Anesthesia Postprocedure Evaluation (Signed)
  Anesthesia Post-op Note  Patient: [name]  Procedure(s) Performed: Procedure(s): RIGHT TOTAL KNEE ARTHROPLASTY (Right)  Patient Location: PACU  Anesthesia Type:General  Level of Consciousness: awake  Airway and Oxygen Therapy: Patient Spontanous Breathing  Post-op Pain: mild  Post-op Assessment: Post-op Vital signs reviewed  Post-op Vital Signs: Reviewed  Last Vitals:  Filed Vitals:   02/04/14 1015  BP: 137/77  Pulse:   Temp: 36.6 C  Resp: 15    Complications: No apparent anesthesia complications

## 2014-02-04 NOTE — Progress Notes (Signed)
Orthopedic Tech Progress Note Patient Details:  [name] Saks 03/28/1951 [id number] On cpm at 7:45 pm Patient ID: [name] Hoppes, female   DOB: 08/01/1950, 63 y.o.   MRN: [id number]   Jennye MoccasinHughes, Ast[place] Jordahl Craig 02/04/2014, 7:47 PM

## 2014-02-04 NOTE — Progress Notes (Signed)
Utilization review completed.  

## 2014-02-04 NOTE — Progress Notes (Signed)
Orthopedics Progress Note  Subjective: Patient comfortable after TKA, resting in a chair with knee extended (bone foam)  Objective:  Filed Vitals:   02/04/14 1503  BP:   Pulse:   Temp:   Resp: 18    General: Awake and alert  Musculoskeletal: right knee dressing intact, NVI Neurovascularly intact  Lab Results  Component Value Date   WBC 9.8 01/26/2014   HGB 13.3 01/26/2014   HCT 40.9 01/26/2014   MCV 89.5 01/26/2014   PLT 339 01/26/2014       Component Value Date/Time   NA 141 01/26/2014 1348   K 4.2 01/26/2014 1348   CL 103 01/26/2014 1348   CO2 24 01/26/2014 1348   GLUCOSE 96 01/26/2014 1348   BUN 11 01/26/2014 1348   CREATININE 0.66 01/26/2014 1348   CALCIUM 9.3 01/26/2014 1348   GFRNONAA >90 01/26/2014 1348   GFRAA >90 01/26/2014 1348    Lab Results  Component Value Date   INR 0.97 01/26/2014   INR 2.4 05/31/2010   INR 2.4 05/03/2010    Assessment/Plan: POD #0 s/p Procedure(s): RIGHT TOTAL KNEE ARTHROPLASTY Stable post op, XRAYS look great, continue PT, OT, DVT prophylaxis, mobilization D/C planned for Monday  Viviann SpareSteven R. Ranell PatrickNorris, MD 02/04/2014 4:29 PM

## 2014-02-04 NOTE — Progress Notes (Signed)
ANTICOAGULATION CONSULT NOTE - Initial Consult  Pharmacy Consult for Coumadin Indication: VTE prophylaxis  Allergies  Allergen Reactions  . Penicillins Swelling    Patient Measurements: Height: 5\' 3"  (160 cm) (documented on 12/06/13) Weight: 272 lb (123.378 kg) IBW/kg (Calculated) : 52.4   Vital Signs: Temp: 98.4 F (36.9 C) (10/23 1324) Temp Source: Oral (10/23 1324) BP: 132/68 mmHg (10/23 1324) Pulse Rate: 89 (10/23 1324)  Labs:  Preop labs on 01/26/14 SCR 0.66   WBC 9.8     H/H 13.3/40.9   PLTC 339K PTT 28       PT 13.0       INR 0.97   No results found for this basename: HGB, HCT, PLT, APTT, LABPROT, INR, HEPARINUNFRC, CREATININE, CKTOTAL, CKMB, TROPONINI,  in the last 72 hours  Estimated Creatinine Clearance: 89.4 ml/min (by C-G formula based on Cr of 0.66).   Medical History: Past Medical History  Diagnosis Date  . Osteoarthritis     knee  . Allergic rhinitis   . History of DVT (deep vein thrombosis)   . History of pulmonary embolus (PE)     post-op LLL PE 11/2009  . Family history of diabetes mellitus   . Other specified congenital anomaly of skin   . Peripheral vascular disease   . Chronic kidney disease     urines a lot    Medications:  Prescriptions prior to admission  Medication Sig Dispense Refill  . naproxen sodium (ANAPROX) 220 MG tablet Take 220 mg by mouth daily as needed (for pain).        Scheduled:  . clindamycin (CLEOCIN) IV  600 mg Intravenous Q6H  . [START ON 02/05/2014] enoxaparin (LOVENOX) injection  30 mg Subcutaneous Q12H  . ferrous sulfate  325 mg Oral TID PC  . HYDROmorphone      . methocarbamol      . metoCLOPramide      . oxyCODONE        Assessment: 63 y.o female s/p R TKA today  to start on coumadin for VTE prophylaxis.  She has a history of DVT/PE.  Not on any anticoagulation medication PTA.  MD has ordered lovenox 30mg  SQ q12h post op to begin tomorrow AM to discontinue when INR >/= 1.8. No bleeding noted. Baseline  labs:  H/H 13.3/40.9   PLTC 339K  PTT 28    PT 13.0     INR 0.97    Goal of Therapy:  INR 2-3 Monitor platelets by anticoagulation protocol: Yes   Plan:  Coumadin 5 mg po today x1 INR daily  DC Lovenox when INR >/= 1.8.    Noah Delaineuth Bryker Fletchall, RPh Clinical Pharmacist Pager: [phone] 02/04/2014,1:41 PM

## 2014-02-04 NOTE — Op Note (Signed)
NAMDeirdre [name]:  [name], [name]                 ACCOUNT NO.:  [id number]  MEDICAL RECORD NO.:  [id number]  LOCATION:  [facility]                         FACILITY:  [facility]  PHYSICIAN:  Almedia BallsSteven R. Ranell PatrickNorris, M.D. DATE OF BIRTH:  Feb 05, 1951  DATE OF PROCEDURE:  02/04/2014 DATE OF DISCHARGE:                              OPERATIVE REPORT   PREOPERATIVE DIAGNOSIS:  Right knee end-stage osteoarthritis.  POSTOPERATIVE DIAGNOSIS:  Right knee end-stage osteoarthritis.  PROCEDURE PERFORMED:  Right total knee replacement using DePuy Sigma rotating platform prosthesis with MBT revision tray on the tibia.  ATTENDING SURGEON:  Almedia BallsSteven R. Ranell PatrickNorris, M.D.  ASSISTANT:  Donnie Coffinhomas B. Dixon, PA-C, who scrubbed the entire procedure and necessary for satisfactory completion of surgery.  ANESTHESIA:  General anesthesia was used plus femoral block.  ESTIMATED BLOOD LOSS:  Minimal.  FLUID REPLACEMENT:  1200 mL of crystalloid.  INSTRUMENT COUNTS:  Correct.  COMPLICATIONS:  There were no complications.  ANTIBIOTICS:  Perioperative antibiotics were given.  INDICATIONS:  The patient is a 63 year old female, who presents with worsening right knee pain secondary to end-stage arthritis.  The patient has disabling pain due to the arthritis.  She has had prior left total knee replacement 4 years ago and did well with that.  Right knee films demonstrated bone-on-bone on weightbearing films.  The patient requiring use of an assisted device to ambulate and limited less than a block. After discussing with the patient the options of management, she would like to proceed with total knee replacement.  Informed consent was obtained.  DESCRIPTION OF PROCEDURE:  After an adequate level of anesthesia was achieved, the patient was positioned supine on the operative table. Right leg correctly identified and nonsterile tourniquet placed on proximal thigh.  Sterile prep and drape performed.  Time-out called.  We padded the other leg and secured it  appropriately.  After exsanguination of the limb using Esmarch bandage, we flexed the knee, and sized the knee using a 10-blade scalpel.  Dissection down through subcutaneous tissues.  Median parapatellar arthrotomy created with a fresh 10-blade scalpel.  We subluxed the patella laterally.  We placed our retractors, entered the distal femur with the step-cut drill, placed our intramedullary resection guide, resected 10 mm off the distal femur, 5 degrees right.  We then could not get our sizing block on the femur due to the patient's extremely large size and very contracted and arthritic knee.  We went ahead and cut the tibia with 2 degrees posterior slope perpendicular to the long axis of the tibia with the external alignment jig and we just took 2 mm off the affected medial side of the knee.  We then were able to get our sizing block on the femur, sized at size 4 anterior down, which would be a 4 narrow femur.  We performed our anterior, posterior, and chamfer cuts with the 4:1 block.  We then went ahead and removed excess osteophytes off the posterior femur using a lamina spreader as well as releasing the capsule.  We then checked our gaps, which were symmetric at 12.5 mm.  We did have to release little bit more of the medial soft tissue sleeve and remove the large osteophyte  off the medial and proximal tibia to be able to get the medial gap symmetric with the lateral gap.  Once that was achieved, we then completed our tibial preparation with the modular drill and keel punch for the MBT revision tray and then we went ahead and did our box cut for the posterior cruciate substituting femoral prosthesis and used the box cut guide for the femur.  Oscillating saw utilized.  We then placed our size 4 narrow femur on the right and then the size 3 tibia MBT revision on the tibial side and then placed a 12.5 polyethylene insert and reduced the knee.  We then resurfaced the patella going from 25  mm thickness down to 17 and then placed a 35 patellar button, drilled those holes.  We had nice patellar tracking with no-touch technique.  We removed all trial components, pulse irrigated the knee, and then dried the knee, and then we went ahead and cemented all components into place with a too high viscosity SmartSet cement.  Then we placed a size 15 poly spacer and reduced the knee and we were able to achieve full extension.  Holding the knee in extension, then we applied the patellar clamp to the patella and cemented into place.  Once the cement was allowed to harden, we removed all excess cement with quarter-inch curved osteotome, inspected the posterior aspect of the knee, and then went ahead and trialed a 17.5, which was able to fit in nicely, so we selected real 17.5 trial size 4 and then snapped that into position, reduced the knee.  We were happy with patellar tracking and alignment and stability, both in flexion and extension, repaired the parapatellar arthrotomy with #1 Vicryl suture followed by 0 and 2-0 layered subcutaneous closure and staples for skin.  Sterile compressive bandage applied.  The patient transported to the recovery room in stable condition.     Almedia BallsSteven R. Ranell PatrickNorris, M.D.     SRN/MEDQ  D:  02/04/2014  T:  02/04/2014  Job:  [id number]

## 2014-02-04 NOTE — Anesthesia Preprocedure Evaluation (Addendum)
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 breath sounds clear to auscultation  Pulmonary exam normal       Cardiovascular hypertension, + Peripheral Vascular Disease Rhythm:Regular Rate:Normal     Neuro/Psych negative neurological ROS  negative psych ROS   GI/Hepatic negative GI ROS, Neg liver ROS,   Endo/Other  negative endocrine ROS  Renal/GU Renal diseasenegative Renal ROS  negative genitourinary   Musculoskeletal  (+) Arthritis -,   Abdominal   Peds  Hematology negative hematology ROS (+)   Anesthesia Other Findings   Reproductive/Obstetrics                         Anesthesia Physical Anesthesia Plan  ASA: II  Anesthesia Plan: General   Post-op Pain Management:    Induction: Intravenous  Airway Management Planned: Oral ETT  Additional Equipment:   Intra-op Plan:   Post-operative Plan: Extubation in OR  Informed Consent: I have reviewed the patients History and Physical, chart, labs and discussed the procedure including the risks, benefits and alternatives for the proposed anesthesia with the patient or authorized representative who has indicated his/her understanding and acceptance.   Dental advisory given  Plan Discussed with: CRNA and Anesthesiologist  Anesthesia Plan Comments:        Anesthesia Quick Evaluation

## 2014-02-04 NOTE — Transfer of Care (Signed)
Immediate Anesthesia Transfer of Care Note  Patient: [name]  Procedure(s) Performed: Procedure(s): RIGHT TOTAL KNEE ARTHROPLASTY (Right)  Patient Location: PACU  Anesthesia Type:General and Regional  Level of Consciousness: awake, alert , oriented and patient cooperative  Airway & Oxygen Therapy: Patient Spontanous Breathing and Patient connected to nasal cannula oxygen  Post-op Assessment: Report given to PACU RN, Post -op Vital signs reviewed and stable and Patient moving all extremities  Post vital signs: Reviewed and stable  Complications: No apparent anesthesia complications

## 2014-02-04 NOTE — Evaluation (Signed)
Physical Therapy Evaluation Patient Details Name: [name] MRN: [id number] DOB: 12/21/1950 Today's Date: 02/04/2014   History of Present Illness  Pt is a 63 y.o. female s/po Rt TKA; previous Lt TKA 4 years ago.   Clinical Impression  Pt is s/p Rt TKA POD#0 resulting in the deficits listed below (see PT Problem List).  Pt will benefit from skilled PT to increase their independence and safety with mobility to allow discharge to the venue listed below. Pt limited to SPT to chair only today due to buckling of Rt LE and c/o dizziness. Anticipate good progress by pt, pt very motivated.     Follow Up Recommendations Home health PT;Supervision/Assistance - 24 hour    Equipment Recommendations  None recommended by PT    Recommendations for Other Services OT consult     Precautions / Restrictions Precautions Precautions: Knee Precaution Comments: reviewed no pillow under knee precaution  Required Braces or Orthoses: Knee Immobilizer - Right;Other Brace/Splint Knee Immobilizer - Right: Other (comment) (maintain ext or with PT if needed ) Other Brace/Splint: bone foam  Restrictions Weight Bearing Restrictions: No RLE Weight Bearing: Weight bearing as tolerated      Mobility  Bed Mobility Overal bed mobility: Needs Assistance Bed Mobility: Supine to Sit     Supine to sit: Min assist;HOB elevated     General bed mobility comments: (A) to advance Rt LE to/off EOB; cues for sequencing  Transfers Overall transfer level: Needs assistance Equipment used: Rolling walker (2 wheeled) Transfers: Sit to/from [employer]GI CorporationStand;Stand Pivot Transfers Sit to Stand: Min assist Stand pivot transfers: Min assist       General transfer comment: (A) to power up to standing position; cues for hand placement and sequencing with RW; limited to SPT due to Rt LE buckling   Ambulation/Gait             General Gait Details: SPT only due to Rt LE buckling   Stairs            Wheelchair  Mobility    Modified Rankin (Stroke Patients Only)       Balance Overall balance assessment: Needs assistance Sitting-balance support: Feet supported;No upper extremity supported Sitting balance-Leahy Scale: Fair Sitting balance - Comments: pt c/o dizziness initially; subsiding after ~5 min   Standing balance support: During functional activity;Bilateral upper extremity supported Standing balance-Leahy Scale: Poor Standing balance comment: relying on RW for bil UE support and balance                              Pertinent Vitals/Pain      Home Living Family/patient expects to be discharged to:: Private residence Living Arrangements: Spouse/significant other Available Help at Discharge: Family;Available 24 hours/day Type of Home: House Home Access: Level entry     Home Layout: One level Home Equipment: Walker - 2 wheels;Bedside commode;Shower seat;Tub bench Additional Comments: pt has tub shower but has shower bench    Prior Function Level of Independence: Independent               Hand Dominance   Dominant Hand: Right    Extremity/Trunk Assessment   Upper Extremity Assessment: Defer to OT evaluation           Lower Extremity Assessment: RLE deficits/detail RLE Deficits / Details: limited due to block; Rt quad grossly 2/5     Cervical / Trunk Assessment: Normal  Communication   Communication: No difficulties  Cognition Arousal/Alertness:  Awake/alert Behavior During Therapy: WFL for tasks assessed/performed Overall Cognitive Status: Within Functional Limits for tasks assessed                      General Comments      Exercises Total Joint Exercises Ankle Circles/Pumps: AROM;Both;10 reps;Supine      Assessment/Plan    PT Assessment Patient needs continued PT services  PT Diagnosis Difficulty walking;Acute pain;Generalized weakness   PT Problem List Decreased strength;Decreased activity tolerance;Decreased range of  motion;Decreased balance;Decreased mobility;Decreased safety awareness;Decreased knowledge of use of DME;Pain;Impaired sensation  PT Treatment Interventions DME instruction;Gait training;Therapeutic activities;Therapeutic exercise;Functional mobility training;Balance training;Neuromuscular re-education;Patient/family education   PT Goals (Current goals can be found in the Care Plan section) Acute Rehab PT Goals Patient Stated Goal: to go home monday PT Goal Formulation: With patient Time For Goal Achievement: 02/09/14 Potential to Achieve Goals: Good    Frequency 7X/week   Barriers to discharge        Co-evaluation               End of Session Equipment Utilized During Treatment: Gait belt;Right knee immobilizer;Oxygen Activity Tolerance: Patient tolerated treatment well Patient left: in chair;with call bell/phone within reach;with family/visitor present Nurse Communication: Mobility status;Precautions;Weight bearing status         Time: 1341-1406 PT Time Calculation (min): 25 min   Charges:   PT Evaluation $Initial PT Evaluation Tier I: 1 Procedure PT Treatments $Therapeutic Activity: 8-22 mins   PT G CodesDonell [name]:          [name], [place]PT  [phone] 02/04/2014, 2:16 PM

## 2014-02-04 NOTE — Addendum Note (Signed)
Addendum created 02/04/14 1125 by Judie Petitharlene Edwards, MD   Modules edited: Anesthesia Blocks and Procedures, Clinical Notes   Clinical Notes:  File: [id number]

## 2014-02-04 NOTE — Brief Op Note (Signed)
02/04/2014  10:32 AM  PATIENT:  Kara PinchJudy P Tamburri  63 y.o. female  PRE-OPERATIVE DIAGNOSIS:  OA RIGHT KNEE, END STAGE  POST-OPERATIVE DIAGNOSIS:  OA RIGHT KNEE, END STAGE  PROCEDURE:  Procedure(s): RIGHT TOTAL KNEE ARTHROPLASTY (Right), DePuy Sigma RP, MBT REV TRAY TIBIA  SURGEON:  Surgeon(s) and Role:    * Verlee RossettiSteven R Walther Sanagustin, MD - Primary  PHYSICIAN ASSISTANT:   ASSISTANTS: Thea Gisthomas B Dixon, PA-C   ANESTHESIA:   regional and general  EBL:  Total I/O In: 800 [I.V.:800] Out: 155 [Urine:105; Blood:50]  BLOOD ADMINISTERED:none  DRAINS: none   LOCAL MEDICATIONS USED:  NONE  SPECIMEN:  No Specimen  DISPOSITION OF SPECIMEN:  N/A  COUNTS:  YES  TOURNIQUET:   Total Tourniquet Time Documented: Thigh (Right) - 120 minutes Total: Thigh (Right) - 120 minutes   DICTATION: .Other Dictation: Dictation Number [id number]  PLAN OF CARE: Admit to inpatient   PATIENT DISPOSITION:  PACU - hemodynamically stable.   Delay start of Pharmacological VTE agent (>24hrs) due to surgical blood loss or risk of bleeding: no

## 2014-02-04 NOTE — Interval H&P Note (Signed)
History and Physical Interval Note:  02/04/2014 7:32 AM  Kara PinchJudy P [name]  has presented today for surgery, with the diagnosis of OA RIGHT KNEE   The various methods of treatment have been discussed with the patient and family. After consideration of risks, benefits and other options for treatment, the patient has consented to  Procedure(s): RIGHT TOTAL KNEE ARTHROPLASTY (Right) as a surgical intervention .  The patient's history has been reviewed, patient examined, no change in status, stable for surgery.  I have reviewed the patient's chart and labs.  Questions were answered to the patient's satisfaction.     Deanndra Kirley,STEVEN R

## 2014-02-04 NOTE — Progress Notes (Signed)
Orthopedic Tech Progress Note Patient Details:  Jeral PinchJudy P Geraci 11/26/1950 [id number]  CPM Right Knee CPM Right Knee: On Right Knee Flexion (Degrees): 90 Right Knee Extension (Degrees): 0 Additional Comments: trapeze bar patient helper   Nikki DomCrawford, Kashana Breach 02/04/2014, 11:42 AM

## 2014-02-04 NOTE — Anesthesia Procedure Notes (Addendum)
Procedure Name: Intubation Date/Time: 02/04/2014 7:41 AM Performed by: Adonis HousekeeperNGELL, JANNA M Pre-anesthesia Checklist: Patient identified, Emergency Drugs available, Patient being monitored and Suction available Patient Re-evaluated:Patient Re-evaluated prior to inductionOxygen Delivery Method: Circle system utilized Preoxygenation: Pre-oxygenation with 100% oxygen Intubation Type: IV induction Ventilation: Mask ventilation without difficulty Laryngoscope Size: Miller and 2 Grade View: Grade I Tube type: Oral Tube size: 7.0 mm Number of attempts: 1 Airway Equipment and Method: Stylet Placement Confirmation: ETT inserted through vocal cords under direct vision,  positive ETCO2 and breath sounds checked- equal and bilateral Secured at: 21 cm Tube secured with: Tape Dental Injury: Teeth and Oropharynx as per pre-operative assessment    Anesthesia Regional Block:  Femoral nerve block  Pre-Anesthetic Checklist: ,, timeout performed, Correct Patient, Correct Site, Correct Laterality, Correct Procedure, Correct Position, site marked, Risks and benefits discussed, Surgical consent,  Pre-op evaluation,  At surgeon's request  Laterality: Right  Prep: chloraprep       Needles:   Needle Type: Stimulator Needle - 80          Additional Needles:  Procedures: Doppler guided, ultrasound guided (picture in chart) and nerve stimulator Femoral nerve block  Nerve Stimulator or Paresthesia:  Response: 0.5 mA,   Additional Responses:   Narrative:  Start time: 02/04/2014 6:55 AM End time: 02/04/2014 7:10 AM Injection made incrementally with aspirations every 5 mL.  Performed by: Personally  Anesthesiologist: Dr. Randa EvensEdwards

## 2014-02-05 LAB — BASIC METABOLIC PANEL
Anion gap: 10 (ref 5–15)
BUN: 14 mg/dL (ref 6–23)
CO2: 25 mEq/L (ref 19–32)
Calcium: 8.6 mg/dL (ref 8.4–10.5)
Chloride: 104 mEq/L (ref 96–112)
Creatinine, Ser: 0.81 mg/dL (ref 0.50–1.10)
GFR calc Af Amer: 86 mL/min — ABNORMAL LOW (ref >90)
GFR calc non Af Amer: 75 mL/min — ABNORMAL LOW (ref >90)
Glucose, Bld: 118 mg/dL — ABNORMAL HIGH (ref 70–99)
Potassium: 4.2 mEq/L (ref 3.7–5.3)
Sodium: 139 mEq/L (ref 137–147)

## 2014-02-05 LAB — CBC
HCT: 33.7 % — ABNORMAL LOW (ref 36.0–46.0)
Hemoglobin: 10.9 g/dL — ABNORMAL LOW (ref 12.0–15.0)
MCH: 28.7 pg (ref 26.0–34.0)
MCHC: 32.3 g/dL (ref 30.0–36.0)
MCV: 88.7 fL (ref 78.0–100.0)
Platelets: 262 10*3/uL (ref 150–400)
RBC: 3.8 MIL/uL — ABNORMAL LOW (ref 3.87–5.11)
RDW: 14.5 % (ref 11.5–15.5)
WBC: 13.6 10*3/uL — ABNORMAL HIGH (ref 4.0–10.5)

## 2014-02-05 LAB — PROTIME-INR
INR: 1.15 (ref 0.00–1.49)
Prothrombin Time: 14.9 seconds (ref 11.6–15.2)

## 2014-02-05 MED ORDER — WARFARIN SODIUM 5 MG PO TABS
5.0000 mg | ORAL_TABLET | Freq: Once | ORAL | Status: AC
  Administered 2014-02-05: 5 mg via ORAL
  Filled 2014-02-05: qty 1

## 2014-02-05 NOTE — Care Management Note (Addendum)
CARE MANAGEMENT NOTE 02/05/2014  Patient:  [name],[name]   Account Number:  [id number]  Date Initiated:  02/05/2014  Documentation initiated by:  Vance PeperBRADY,Quaran Kedzierski  Subjective/Objective Assessment:   63 yr old female admitted with osteoarthritis of right knee. Patient s/[name] right total knee arthroplasty.     Action/Plan:   Case manager spoke with patient and husband concerning home health and DME needs at discharge. [facility] no [facility]. has rolling walker, 3in1,bath chair and bench. Has family support at D/C.   Anticipated DC Date:  02/07/2014   Anticipated DC Plan:  HOME W HOME HEALTH SERVICES      DC Planning Services  CM consult      [facility]   Choice offered to / List presented to:  C-1 Patient   DME arranged  NA        HH arranged  HH-2 PT    HH-1RN   [facility]  [facility]   Status of service:  Completed, signed off Medicare Important Message given?   (If response is "NO", the following Medicare IM given date fields will be blank) Date Medicare IM given:   Medicare IM given by:   Date Additional Medicare IM given:   Additional Medicare IM given by:    Discharge Disposition:  HOME W HOME HEALTH SERVICES  Per UR Regulation:  Reviewed for med. necessity/level of care/duration of stay

## 2014-02-05 NOTE — Progress Notes (Signed)
ANTICOAGULATION CONSULT NOTE - Follow Up Consult  Pharmacy Consult for Coumadin Indication: VTE prophylaxis  Allergies  Allergen Reactions  . Penicillins Swelling    Patient Measurements: Height: 5\' 3"  (160 cm) (documented on 12/06/13) Weight: 272 lb (123.378 kg) IBW/kg (Calculated) : 52.4  Vital Signs: Temp: 100 F (37.8 C) (10/24 0459) BP: 133/54 mmHg (10/24 0459) Pulse Rate: 103 (10/24 0459)  Labs:  Recent Labs  02/04/14 1943 02/05/14 0446  HGB 11.2* 10.9*  HCT 34.6* 33.7*  PLT 285 262  LABPROT  --  14.9  INR  --  1.15  CREATININE 0.83 0.81    Estimated Creatinine Clearance: 88.3 ml/min (by C-G formula based on Cr of 0.81).   Medications:  Scheduled:  . enoxaparin (LOVENOX) injection  30 mg Subcutaneous Q12H  . ferrous sulfate  325 mg Oral TID PC  . warfarin   Does not apply Once  . Warfarin - Pharmacist Dosing Inpatient   Does not apply q1800    Assessment: 63 yo f admitted on 10/23 for surgery.  Patient is now s/p R TKA and is started on coumadin for VTE prophylaxis.  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 Pharmacy is consulted to dose and adjust coumadin.  Patient also on enoxaparin 30 mg SQ q12hr until INR is 1.8 per MD. Baseline INR was 0.97.  Patient received 5 mg x 1 last night. AM INR is 1.15. Will give another dose of 5 mg PO x 1 tonight. Hgb 10.9, plt 262, no s/s of bleeding.  Goal of Therapy:  INR 2-3 Monitor platelets by anticoagulation protocol: Yes   Plan:  Coumadin 5 mg PO x 1 tonight Continue enoxaparin 30 mg SQ q12hr until INR is 1.8 Daily INR Monitor CBC, INR, s/s of bleeding, clinical course of patient  Rosell Khouri L. Roseanne RenoStewart, PharmD Clinical Pharmacy Resident Pager: [phone] 02/05/2014 7:50 AM

## 2014-02-05 NOTE — Progress Notes (Signed)
Patient has received the coumadin book.

## 2014-02-05 NOTE — Progress Notes (Signed)
Patient ID: Kara PinchJudy P Havlicek, female   DOB: 06/13/1950, 63 y.o.   MRN: [id number] Subjective: 1 Day Post-Op Procedure(s) (LRB): RIGHT TOTAL KNEE ARTHROPLASTY (Right)    Patient reports pain as mild.  Had taken pain med since last night. In CPM this am comfortable  Objective:   VITALS:   Filed Vitals:   02/05/14 0459  BP: 133/54  Pulse: 103  Temp: 100 F (37.8 C)  Resp: 16    Neurovascular intact Incision: dressing C/D/I  LABS  Recent Labs  02/04/14 1943 02/05/14 0446  HGB 11.2* 10.9*  HCT 34.6* 33.7*  WBC 15.8* 13.6*  PLT 285 262     Recent Labs  02/04/14 1943 02/05/14 0446  NA  --  139  K  --  4.2  BUN  --  14  CREATININE 0.83 0.81  GLUCOSE  --  118*     Recent Labs  02/05/14 0446  INR 1.15     Assessment/Plan: 1 Day Post-Op Procedure(s) (LRB): RIGHT TOTAL KNEE ARTHROPLASTY (Right)   Advance diet Up with therapy Discharge home with home health Monday per Ranell PatrickNorris  Dressing change tomorrow per rounding team

## 2014-02-05 NOTE — Evaluation (Signed)
Occupational Therapy Evaluation Patient Details Name: [name] MRN: [id number] DOB: 06/30/1950 Today's Date: 02/05/2014    History of Present Illness Pt is a 63 y.o. female s/p Rt TKA; previous Lt TKA 4 years ago.    Clinical Impression   Pt s/p above. Pt independent with ADLs, PTA. Education provided to pt and spouse and he will be there to assist as needed. OT signing off.    Follow Up Recommendations  No OT follow up;Supervision - Intermittent    Equipment Recommendations  None recommended by OT    Recommendations for Other Services       Precautions / Restrictions Precautions Precautions: Knee Precaution Comments: educated on precautions Required Braces or Orthoses: Knee Immobilizer - Right;Other Brace/Splint Knee Immobilizer - Right Other Brace/Splint: bone foam  Restrictions Weight Bearing Restrictions: No RLE Weight Bearing: Weight bearing as tolerated      Mobility Bed Mobility     General bed mobility comments: discussed using sheet to help assist LE. Not assessed  Transfers Overall transfer level: Needs assistance Equipment used: Rolling walker (2 wheeled) Transfers: Sit to/from Stand Sit to Stand: Min guard                 ADL Overall ADL's : Needs assistance/impaired     Grooming: Min guard;Standing;Sitting;Wash/dry hands;Wash/dry face               Lower Body Dressing: Minimal assistance;With adaptive equipment;Sit to/from stand   Toilet Transfer: Min guard;Ambulation;RW (3 in 1 over commode)   Toileting- Clothing Manipulation and Hygiene: Minimal assistance;Sit to/from stand       Functional mobility during ADLs: Min guard;Rolling walker General ADL Comments: OT assisted in helping pt wipe more thoroughly (right handed and IV on that side). Educated on safety tips for home (safe shoewear, use of bag on walker, sitting for most of LB ADLs, rugs, pet). Discussed knee immobilizer and footsie roll. Educated on benefit of reaching  to donn/doff right sock and clothing as it allows knee to bend.  Educated on AE. Pt able to verbalize how to get in tub using bench (OT recommended not stepping over tub now)-pt plans to sponge bathe for a little while.     Vision                     Perception     Praxis      Pertinent Vitals/Pain Pain Assessment: No/denies pain Pain Intervention(s): Monitored during session  O2 in 80-90's on RA and on O2-educated on deep breathing.      Hand Dominance     Extremity/Trunk Assessment Upper Extremity Assessment Upper Extremity Assessment: Overall WFL for tasks assessed   Lower Extremity Assessment Lower Extremity Assessment: Defer to PT evaluation       Communication Communication Communication: No difficulties   Cognition Arousal/Alertness: Awake/alert Behavior During Therapy: WFL for tasks assessed/performed Overall Cognitive Status: Within Functional Limits for tasks assessed                     General Comments          Shoulder Instructions      Home Living Family/patient expects to be discharged to:: Private residence Living Arrangements: Spouse/significant other Available Help at Discharge: Family;Available 24 hours/day Type of Home: House Home Access: Level entry     Home Layout: One level     Bathroom Shower/Tub: Tub/shower unit         Home Equipment: Environmental consultantWalker - 2  wheels;Bedside commode;Shower seat;Tub bench;Adaptive equipment Adaptive Equipment: Reacher        Prior Functioning/Environment Level of Independence: Independent             OT Diagnosis: Acute pain   OT Problem List:     OT Treatment/Interventions:      OT Goals(Current goals can be found in the care plan section)   OT Frequency:     Barriers to D/C:            Co-evaluation              End of Session Equipment Utilized During Treatment: Gait belt;Rolling walker;Right knee immobilizer CPM Right Knee CPM Right Knee: Off  Activity  Tolerance: Other (comment) (dizziness) Patient left: in chair;with call bell/phone within reach;with family/visitor present   Time: 1121-1150 OT Time Calculation (min): 29 min Charges:  OT General Charges $OT Visit: 1 Procedure OT Evaluation $Initial OT Evaluation Tier I: 1 Procedure OT Treatments $Self Care/Home Management : 8-22 mins G-CodesEarlie [name]:    [name] OTR/[name] [phone] 02/05/2014, 12:14 PM

## 2014-02-05 NOTE — Progress Notes (Signed)
Unable to show patient coumadin video d/t video education line not working. IT notified at approximately 1100.  Video education line still not working. Patient states she has taken coumadin before and is aware of this medication's purpose and adverse effects. Just called IT again and IT said he will call me back soon.

## 2014-02-05 NOTE — Progress Notes (Signed)
Physical Therapy Treatment Patient Details Name: [name] MRN: [id number] DOB: 06/18/1950 Today's Date: 02/05/2014    History of Present Illness Pt is a 63 y.o. female s/po Rt TKA; previous Lt TKA 4 years ago.     PT Comments    Pt limited in gt today due to near syncopal episode sitting on toilet. Pt alert and oriented throughout but became diaphoretic, dizzy, pale and nauseous; requiring immediate return to chair. Pt very motivated and able to perform exercises in chair. Cont to follow per POC.   Follow Up Recommendations  Home health PT;Supervision/Assistance - 24 hour     Equipment Recommendations  None recommended by PT    Recommendations for Other Services       Precautions / Restrictions Precautions Precautions: Knee Precaution Comments: reviewed no pillow under knee precaution  Required Braces or Orthoses: Knee Immobilizer - Right;Other Brace/Splint Knee Immobilizer - Right: Other (comment) Other Brace/Splint: bone foam  Restrictions Weight Bearing Restrictions: No RLE Weight Bearing: Weight bearing as tolerated    Mobility  Bed Mobility Overal bed mobility: Needs Assistance Bed Mobility: Supine to Sit     Supine to sit: Supervision     General bed mobility comments: supervision for cues for sequencing   Transfers Overall transfer level: Needs assistance Equipment used: Rolling walker (2 wheeled) Transfers: Sit to/from Stand Sit to Stand: Mod assist;Min assist;From elevated surface         General transfer comment: pt with initial difficulties powering up from lower surface bed; bed elevated and mod (A) to push up from bed; pt min (A) from toilet seat  Ambulation/Gait Ambulation/Gait assistance: Min assist Ambulation Distance (Feet): 12 Feet Assistive device: Rolling walker (2 wheeled) Gait Pattern/deviations: Step-to pattern;Decreased stance time - right;Decreased step length - left;Trunk flexed;Antalgic Gait velocity: decreased Gait velocity  interpretation: Below normal speed for age/gender General Gait Details: pt limited due to dizziness and lightheadedness; cues for gt sequencing and upright posture; min (A) to manage RW   Stairs            Wheelchair Mobility    Modified Rankin (Stroke Patients Only)       Balance Overall balance assessment: Needs assistance Sitting-balance support: Feet supported Sitting balance-Leahy Scale: Fair Sitting balance - Comments: guarded sitting EOB; no c/o dizziness   Standing balance support: During functional activity;Bilateral upper extremity supported Standing balance-Leahy Scale: Poor Standing balance comment: relying on RW for balance                     Cognition Arousal/Alertness: Awake/alert Behavior During Therapy: WFL for tasks assessed/performed Overall Cognitive Status: Within Functional Limits for tasks assessed                      Exercises Total Joint Exercises Ankle Circles/Pumps: AROM;Both;10 reps;Supine Quad Sets: AROM;Strengthening;Right;10 reps;Seated Heel Slides: AAROM;Right;10 reps;Seated Hip ABduction/ADduction: AAROM;Right;10 reps;Seated Long Arc Quad: AROM;Right;10 reps;Seated Goniometric ROM: AAROM 0 to 45 in sitting; limited by pain     General Comments General comments (skin integrity, edema, etc.): pt became diaphoretic; pale, dizzy and nausous sitting on toilet; returned to chair; pt verbal throughout but near syncopal episode       Pertinent Vitals/Pain Pain Assessment: No/denies pain Pain Intervention(s): Premedicated before session    Home Living                      Prior Function  PT Goals (current goals can now be found in the care plan section) Acute Rehab PT Goals Patient Stated Goal: to go home monday PT Goal Formulation: With patient Time For Goal Achievement: 02/09/14 Potential to Achieve Goals: Good Progress towards PT goals: Progressing toward goals    Frequency  7X/week     PT Plan Current plan remains appropriate    Co-evaluation             End of Session Equipment Utilized During Treatment: Gait belt;Right knee immobilizer;Oxygen Activity Tolerance: Other (comment) (limited by dizziness/ near syncopal episode ) Patient left: in chair;with call bell/phone within reach;with family/visitor present     Time: 1004-1030 PT Time Calculation (min): 26 min  Charges:  $Gait Training: 8-22 mins $Therapeutic Exercise: 8-22 mins                    G CodesDonell [name]:      [name] N, [place]  [phone] 02/05/2014, 11:57 AM

## 2014-02-06 LAB — CBC
HCT: 32.8 % — ABNORMAL LOW (ref 36.0–46.0)
Hemoglobin: 10.7 g/dL — ABNORMAL LOW (ref 12.0–15.0)
MCH: 28.6 pg (ref 26.0–34.0)
MCHC: 32.6 g/dL (ref 30.0–36.0)
MCV: 87.7 fL (ref 78.0–100.0)
Platelets: 246 10*3/uL (ref 150–400)
RBC: 3.74 MIL/uL — ABNORMAL LOW (ref 3.87–5.11)
RDW: 14.2 % (ref 11.5–15.5)
WBC: 20.9 10*3/uL — ABNORMAL HIGH (ref 4.0–10.5)

## 2014-02-06 LAB — PROTIME-INR
INR: 1.4 (ref 0.00–1.49)
Prothrombin Time: 17.3 seconds — ABNORMAL HIGH (ref 11.6–15.2)

## 2014-02-06 MED ORDER — WARFARIN SODIUM 5 MG PO TABS
5.0000 mg | ORAL_TABLET | Freq: Once | ORAL | Status: AC
  Administered 2014-02-06: 5 mg via ORAL
  Filled 2014-02-06: qty 1

## 2014-02-06 NOTE — Discharge Instructions (Signed)
Information on my medicine - Coumadin   (Warfarin)  This medication education was reviewed with me or my healthcare representative as part of my discharge preparation.  The pharmacist that spoke with me during my hospital stay was:  Nicole Kindred, Mickey Esguerra L, RPH  Why was Coumadin prescribed for you? Coumadin was prescribed for you because you have a blood clot or a medical condition that can cause an increased risk of forming blood clots. Blood clots can cause serious health problems by blocking the flow of blood to the heart, lung, or brain. Coumadin can prevent harmful blood clots from forming. As a reminder your indication for Coumadin is:   Blood Clot Prevention After Orthopedic Surgery  What test will check on my response to Coumadin? While on Coumadin (warfarin) you will need to have an INR test regularly to ensure that your dose is keeping you in the desired range. The INR (international normalized ratio) number is calculated from the result of the laboratory test called prothrombin time (PT).  If an INR APPOINTMENT HAS NOT ALREADY BEEN MADE FOR YOU please schedule an appointment to have this lab work done by your health care provider within 7 days. Your INR goal is usually a number between:  2 to 3 or your provider may give you a more narrow range like 2-2.5.  Ask your health care provider during an office visit what your goal INR is.  What  do you need to  know  About  COUMADIN? Take Coumadin (warfarin) exactly as prescribed by your healthcare provider about the same time each day.  DO NOT stop taking without talking to the doctor who prescribed the medication.  Stopping without other blood clot prevention medication to take the place of Coumadin may increase your risk of developing a new clot or stroke.  Get refills before you run out.  What do you do if you miss a dose? If you miss a dose, take it as soon as you remember on the same day then continue your regularly scheduled regimen the next  day.  Do not take two doses of Coumadin at the same time.  Important Safety Information A possible side effect of Coumadin (Warfarin) is an increased risk of bleeding. You should call your healthcare provider right away if you experience any of the following:   Bleeding from an injury or your nose that does not stop.   Unusual colored urine (red or dark brown) or unusual colored stools (red or black).   Unusual bruising for unknown reasons.   A serious fall or if you hit your head (even if there is no bleeding).  Some foods or medicines interact with Coumadin (warfarin) and might alter your response to warfarin. To help avoid this:   Eat a balanced diet, maintaining a consistent amount of Vitamin K.   Notify your provider about major diet changes you plan to make.   Avoid alcohol or limit your intake to 1 drink for women and 2 drinks for men per day. (1 drink is 5 oz. wine, 12 oz. beer, or 1.5 oz. liquor.)  Make sure that ANY health care provider who prescribes medication for you knows that you are taking Coumadin (warfarin).  Also make sure the healthcare provider who is monitoring your Coumadin knows when you have started a new medication including herbals and non-prescription products.  Coumadin (Warfarin)  Major Drug Interactions  Increased Warfarin Effect Decreased Warfarin Effect  Alcohol (large quantities) Antibiotics (esp. Septra/Bactrim, Flagyl, Cipro) Amiodarone (Cordarone) Aspirin (  ASA) °Cimetidine (Tagamet) °Megestrol (Megace) °NSAIDs (ibuprofen, naproxen, etc.) °Piroxicam (Feldene) °Propafenone (Rythmol SR) °Propranolol (Inderal) °Isoniazid (INH) °Posaconazole (Noxafil) Barbiturates (Phenobarbital) °Carbamazepine (Tegretol) °Chlordiazepoxide (Librium) °Cholestyramine (Questran) °Griseofulvin °Oral Contraceptives °Rifampin °Sucralfate (Carafate) °Vitamin K  ° °Coumadin® (Warfarin) Major Herbal Interactions  °Increased Warfarin Effect Decreased Warfarin Effect   °Garlic °Ginseng °Ginkgo biloba Coenzyme Q10 °Green tea °St. John’s wort   ° °Coumadin® (Warfarin) FOOD Interactions  °Eat a consistent number of servings per week of foods HIGH in Vitamin K °(1 serving = ½ cup)  °Collards (cooked, or boiled & drained) °Kale (cooked, or boiled & drained) °Mustard greens (cooked, or boiled & drained) °Parsley *serving size only = ¼ cup °Spinach (cooked, or boiled & drained) °Swiss chard (cooked, or boiled & drained) °Turnip greens (cooked, or boiled & drained)  °Eat a consistent number of servings per week of foods MEDIUM-HIGH in Vitamin K °(1 serving = 1 cup)  °Asparagus (cooked, or boiled & drained) °Broccoli (cooked, boiled & drained, or raw & chopped) °Brussel sprouts (cooked, or boiled & drained) *serving size only = ½ cup °Lettuce, raw (green leaf, endive, romaine) °Spinach, raw °Turnip greens, raw & chopped  ° °These websites have more information on Coumadin (warfarin):  www.coumadin.com; °www.ahrq.gov/consumer/coumadin.htm; ° ° ° °

## 2014-02-06 NOTE — Progress Notes (Signed)
Orthopedics Progress Note  Subjective: Patient reports doing well with pain controlled. Able to get to the bathroom with assistance.  Objective:  Filed Vitals:   02/06/14 0548  BP: 120/58  Pulse: 99  Temp: 97.4 F (36.3 C)  Resp: 16    General: Awake and alert  Musculoskeletal: right knee incision clean and dry and intact, no erythema, no drainage. Dressing changed Neg Homan's Neurovascularly intact  Lab Results  Component Value Date   WBC 20.9* 02/06/2014   HGB 10.7* 02/06/2014   HCT 32.8* 02/06/2014   MCV 87.7 02/06/2014   PLT 246 02/06/2014       Component Value Date/Time   NA 139 02/05/2014 0446   K 4.2 02/05/2014 0446   CL 104 02/05/2014 0446   CO2 25 02/05/2014 0446   GLUCOSE 118* 02/05/2014 0446   BUN 14 02/05/2014 0446   CREATININE 0.81 02/05/2014 0446   CALCIUM 8.6 02/05/2014 0446   GFRNONAA 75* 02/05/2014 0446   GFRAA 86* 02/05/2014 0446    Lab Results  Component Value Date   INR 1.40 02/06/2014   INR 1.15 02/05/2014   INR 0.97 01/26/2014    Assessment/Plan: POD #2 s/p Procedure(s): RIGHT TOTAL KNEE ARTHROPLASTY Leukocytosis noted but no fever. Patient denies dysuria or cough - will follow that closely. Doing well with PT and OT. Anticipate D/C tomorrow  Almedia BallsSteven R. Ranell PatrickNorris, MD 02/06/2014 8:24 AM

## 2014-02-06 NOTE — Progress Notes (Signed)
Physical Therapy Treatment Patient Details Name: [name] MRN: [name] DOB: 07/03/1950 Today's Date: 02/06/2014    History of Present Illness Pt is a 63 y.o. female s/p Rt TKA; previous Lt TKA 4 years ago.     PT Comments    Pt continues to be very motivated with therapy. Was limited this session due to "sore rt knee cap". Pt hopeful to D/C home tomorrow. Will cont to follow per POC.   Follow Up Recommendations  Home health PT;Supervision/Assistance - 24 hour     Equipment Recommendations  None recommended by PT    Recommendations for Other Services       Precautions / Restrictions Precautions Precautions: Knee Precaution Comments: reviewed no pillow under knee and wear of KI  Required Braces or Orthoses: Knee Immobilizer - Right;Other Brace/Splint Knee Immobilizer - Right: Other (comment) Other Brace/Splint: bone foam  Restrictions Weight Bearing Restrictions: No RLE Weight Bearing: Weight bearing as tolerated    Mobility  Bed Mobility Overal bed mobility: Needs Assistance Bed Mobility: Sit to Supine       Sit to supine: Min assist   General bed mobility comments: educated on use of sheet and UEs to lift Rt LE into bed; pt with difficulty liftin Rt LE due to weakness  Transfers Overall transfer level: Needs assistance Equipment used: Rolling walker (2 wheeled) Transfers: Sit to/from Stand Sit to Stand: Min guard         General transfer comment: performed sit to stand from recliner and toilet;  min cues for hand placement only ; min guard to steady due to heavy lean anteriorly   Ambulation/Gait Ambulation/Gait assistance: Min guard Ambulation Distance (Feet): 80 Feet Assistive device: Rolling walker (2 wheeled) Gait Pattern/deviations: Step-to pattern;Step-through pattern;Decreased stance time - right;Decreased step length - left;Antalgic;Drifts right/left Gait velocity: decreased Gait velocity interpretation: Below normal speed for  age/gender General Gait Details: initially ambulating with step to gt; with multimodal cues pt progresing to step through gt; min guard to steady; pt limited by fatigue this session   Stairs            Wheelchair Mobility    Modified Rankin (Stroke Patients Only)       Balance Overall balance assessment: Needs assistance Sitting-balance support: Feet supported;No upper extremity supported Sitting balance-Leahy Scale: Good Sitting balance - Comments: sat EOB for exercises and No difficulty balancing without UE support   Standing balance support: During functional activity;No upper extremity supported Standing balance-Leahy Scale: Fair Standing balance comment: good ability to balance for short periods of time without UE support                    Cognition Arousal/Alertness: Awake/alert Behavior During Therapy: WFL for tasks assessed/performed Overall Cognitive Status: Within Functional Limits for tasks assessed                      Exercises Total Joint Exercises Ankle Circles/Pumps: AROM;Both;Supine;15 reps Short Arc QuadBarbaraann Boys: AAROM;Right;10 reps;Supine Hip ABduction/ADduction: AROM;Right;Strengthening;10 reps;Supine Long Arc Quad: AROM;Right;10 reps;Seated Knee Flexion: AAROM;Right;5 reps;Seated;Other (comment) (limited by "soreness")    General Comments General comments (skin integrity, edema, etc.): educated pt on controls of CPM for safety       Pertinent Vitals/Pain Pain Assessment: No/denies pain Pain Location: c/o "soreness" in Rt "knee cap"  Pain Descriptors / Indicators: Sore Pain Intervention(s): Monitored during session;Premedicated before session;Repositioned    Home Living  Prior Function            PT Goals (current goals can now be found in the care plan section) Acute Rehab PT Goals Patient Stated Goal: walk again today  PT Goal Formulation: With patient Time For Goal Achievement:  02/09/14 Potential to Achieve Goals: Good Progress towards PT goals: Progressing toward goals    Frequency  7X/week    PT Plan Current plan remains appropriate    Co-evaluation             End of Session Equipment Utilized During Treatment: Gait belt;Right knee immobilizer;Oxygen Activity Tolerance: Patient tolerated treatment well Patient left: with call bell/phone within reach;in bed;in CPM     Time: 1440-1506 PT Time Calculation (min): 26 min  Charges:  $Gait Training: 8-22 mins $Therapeutic Exercise: 8-22 mins                    G Codes:      Donnamarie PoagWest, Princella Jaskiewicz [place], [place]  [phone] 02/06/2014, 4:08 PM

## 2014-02-06 NOTE — Progress Notes (Signed)
Physical Therapy Treatment Patient Details Name: Kara PinchJudy P [name] MRN: [id number] DOB: 11/29/1950 Today's Date: 02/06/2014    History of Present Illness Pt is a 63 y.o. female s/p Rt TKA; previous Lt TKA 4 years ago.     PT Comments    Pt mobilizing much better today without any lightheadedness or dizziness. Pt very motivated and hopeful to D/C home tomorrow. Will cont to follow per POC.   Follow Up Recommendations  Home health PT;Supervision/Assistance - 24 hour     Equipment Recommendations  None recommended by PT    Recommendations for Other Services       Precautions / Restrictions Precautions Precautions: Knee Precaution Comments: reviewed knee precautions and wear of KI  Required Braces or Orthoses: Knee Immobilizer - Right;Other Brace/Splint Knee Immobilizer - Right: Other (comment) (maintain ext or with PT if needed) Other Brace/Splint: bone foam  Restrictions Weight Bearing Restrictions: No RLE Weight Bearing: Weight bearing as tolerated    Mobility  Bed Mobility Overal bed mobility: Needs Assistance Bed Mobility: Supine to Sit     Supine to sit: Modified independent (Device/Increase time)     General bed mobility comments: pt relying on handrails and trapeze bar   Transfers Overall transfer level: Needs assistance Equipment used: Rolling walker (2 wheeled) Transfers: Sit to/from Stand Sit to Stand: Min assist;Min guard         General transfer comment: min (A) when transferring from bed; (A) to balance; cues for hand placement and safety   Ambulation/Gait Ambulation/Gait assistance: Min guard Ambulation Distance (Feet): 100 Feet Assistive device: Rolling walker (2 wheeled) Gait Pattern/deviations: Step-to pattern;Antalgic;Decreased stance time - right;Decreased stride length;Decreased step length - left Gait velocity: decreased Gait velocity interpretation: Below normal speed for age/gender General Gait Details: cues for gt sequencing and safety  with RW; slowly progressing to step through gt   Stairs            Wheelchair Mobility    Modified Rankin (Stroke Patients Only)       Balance Overall balance assessment: Needs assistance Sitting-balance support: Feet supported;No upper extremity supported Sitting balance-Leahy Scale: Good     Standing balance support: During functional activity;No upper extremity supported Standing balance-Leahy Scale: Fair Standing balance comment: pt able to stand adn perform pericare with min guard and able to stand at sink without UE support for short amount of time                     Cognition Arousal/Alertness: Awake/alert Behavior During Therapy: WFL for tasks assessed/performed Overall Cognitive Status: Within Functional Limits for tasks assessed                      Exercises Total Joint Exercises Ankle Circles/Pumps: AROM;Both;Supine;15 reps Quad Sets: AROM;Strengthening;Right;10 reps;Seated Heel Slides: AAROM;Right;10 reps;Seated Hip ABduction/ADduction: AAROM;Right;10 reps;Seated Long Arc Quad: AROM;Right;10 reps;Seated Knee Flexion: AAROM;Right;10 reps;Seated Goniometric ROM: AROM in sitting to 50 degrees; limited by pain     General Comments General comments (skin integrity, edema, etc.): encouraged pt to be OOB for all meals with nursing      Pertinent Vitals/Pain Pain Assessment: No/denies pain Pain Location: states "im just sore"  Pain Descriptors / Indicators: Sore Pain Intervention(s): Monitored during session;Premedicated before session;Repositioned;Ice applied    Home Living                      Prior Function  PT Goals (current goals can now be found in the care plan section) Acute Rehab PT Goals Patient Stated Goal: home tomorrow PT Goal Formulation: With patient Time For Goal Achievement: 02/09/14 Potential to Achieve Goals: Good Progress towards PT goals: Progressing toward goals    Frequency   7X/week    PT Plan Current plan remains appropriate    Co-evaluation             End of Session Equipment Utilized During Treatment: Gait belt;Right knee immobilizer;Oxygen Activity Tolerance: Patient tolerated treatment well Patient left: in chair;with call bell/phone within reach;with family/visitor present     Time: [id number] PT Time Calculation (min): 28 min  Charges:  $Gait Training: 8-22 mins $Therapeutic Exercise: 8-22 mins                    G Codes:      Donell SievertWest, [place] N, [place]  [phone] 02/06/2014, 10:55 AM

## 2014-02-06 NOTE — Progress Notes (Signed)
ANTICOAGULATION CONSULT NOTE - Follow Up Consult  Pharmacy Consult for Coumadin Indication: VTE prophylaxis  Allergies  Allergen Reactions  . Penicillins Swelling    Patient Measurements: Height: 5\' 3"  (160 cm) (documented on 12/06/13) Weight: 272 lb (123.378 kg) IBW/kg (Calculated) : 52.4  Vital Signs: Temp: 97.4 F (36.3 C) (10/25 0548) BP: 120/58 mmHg (10/25 0548) Pulse Rate: 99 (10/25 0548)  Labs:  Recent Labs  02/04/14 1943 02/05/14 0446 02/06/14 0425  HGB 11.2* 10.9* 10.7*  HCT 34.6* 33.7* 32.8*  PLT 285 262 246  LABPROT  --  14.9 17.3*  INR  --  1.15 1.40  CREATININE 0.83 0.81  --     Estimated Creatinine Clearance: 88.3 ml/min (by C-G formula based on Cr of 0.81).   Medications:  Scheduled:  . enoxaparin (LOVENOX) injection  30 mg Subcutaneous Q12H  . ferrous sulfate  325 mg Oral TID PC  . warfarin   Does not apply Once  . Warfarin - Pharmacist Dosing Inpatient   Does not apply q1800    Assessment: 63 yo f admitted on 10/23 for surgery. Patient is now s/p R TKA and is started on coumadin for VTE prophylaxis. 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 INR this AM is 1.4 after 2 doses of 5 mg.  Will give another dose of 5 mg tonight and re-dose based on INR tomorrow AM. Patient is also on enoxaparin 40 mg SQ q12hrs until INR is >/= 1.8.  Hgb 10.7, plts 246, no bleeding noted.  Goal of Therapy:  INR 2-3 Monitor platelets by anticoagulation protocol: Yes   Plan:  Coumadin 5 mg PO x 1 again tonight Continue enoxaparin 30 mg SQ q12hr until INR is 1.8 Daily INR Monitor CBC, INR, s/s of bleeding, clinical course  Drezden Seitzinger L. Roseanne RenoStewart, PharmD Clinical Pharmacy Resident Pager: [phone] 02/06/2014 10:15 AM

## 2014-02-07 LAB — CBC
HCT: 32.7 % — ABNORMAL LOW (ref 36.0–46.0)
Hemoglobin: 10.7 g/dL — ABNORMAL LOW (ref 12.0–15.0)
MCH: 29.4 pg (ref 26.0–34.0)
MCHC: 32.7 g/dL (ref 30.0–36.0)
MCV: 89.8 fL (ref 78.0–100.0)
Platelets: 248 10*3/uL (ref 150–400)
RBC: 3.64 MIL/uL — ABNORMAL LOW (ref 3.87–5.11)
RDW: 14 % (ref 11.5–15.5)
WBC: 17.6 10*3/uL — ABNORMAL HIGH (ref 4.0–10.5)

## 2014-02-07 LAB — PROTIME-INR
INR: 1.54 — ABNORMAL HIGH (ref 0.00–1.49)
Prothrombin Time: 18.7 seconds — ABNORMAL HIGH (ref 11.6–15.2)

## 2014-02-07 LAB — CLOSTRIDIUM DIFFICILE BY PCR: Toxigenic C. Difficile by PCR: NEGATIVE

## 2014-02-07 NOTE — Progress Notes (Signed)
   Subjective: 3 Days Post-Op Procedure(s) (LRB): RIGHT TOTAL KNEE ARTHROPLASTY (Right)  Pt c/o having a rough night last night but ready for d/c home today C/o dry heaves this morning but minimal pain in the right knee Patient reports pain as mild.  Objective:   VITALS:   Filed Vitals:   02/07/14 0800  BP:   Pulse:   Temp:   Resp: 18    Right knee incision healing well No signs of drainage or infection nv intact distally  LABS  Recent Labs  02/05/14 0446 02/06/14 0425 02/07/14 0619  HGB 10.9* 10.7* 10.7*  HCT 33.7* 32.8* 32.7*  WBC 13.6* 20.9* 17.6*  PLT 262 246 248     Recent Labs  02/04/14 1943 02/05/14 0446  NA  --  139  K  --  4.2  BUN  --  14  CREATININE 0.83 0.81  GLUCOSE  --  118*     Assessment/Plan: 3 Days Post-Op Procedure(s) (LRB): RIGHT TOTAL KNEE ARTHROPLASTY (Right) D/c home today F/u in 2 weeks Leukocytosis improved Pt in agreement Pulmonary toilet     Alphonsa OverallBrad Unnamed Zeien, MPAS, PA-C  02/07/2014, 9:18 AM

## 2014-02-07 NOTE — Progress Notes (Signed)
D/c instructions and scripts given. Pt verbalized understanding of home care and felt comfortable with discharge. I spoke with Alphonsa OverallBrad Dixon PA about pt having loose stools and the sent stool was C diff negative. Nida BoatmanBrad stated we could continue with plan of care if Pt felt comfortable with going home. Pt agreed with the plan and her husband is at the bedside to take Pt home.

## 2014-02-07 NOTE — Progress Notes (Signed)
Pt had 2 watery bowl movements tonight. Bowel sounds are hyperactive at the moment. Pt has been complaining of dizziness and nausea. Zofran helped. Noticed WBCs are elevated and pt is running low grade temp of 99.1. Pulse is in high 90's, not sure if that is baseline for pt. BP is stable. Oxygen saturation drops to 85 while pt is asleep. Applied 2 liters of oxygen via nasal cannula and sat went back up in the high 90's.

## 2014-02-07 NOTE — Care Management Note (Signed)
IM letter given to patient's husband. Vance PeperSusan Chaz Ronning, RN BSN Case Manager

## 2014-02-07 NOTE — Discharge Summary (Signed)
Physician Discharge Summary   Patient ID: Kara PinchJudy P [name] MRN: [id number] DOB/AGE: 65/05/1950 65 y.o.  Admit date: 02/04/2014 Discharge date: 02/07/2014  Admission Diagnoses:  Active Problems:   Arthritis of knee, right   Discharge Diagnoses:  Same   Surgeries: Procedure(s): RIGHT TOTAL KNEE ARTHROPLASTY on 02/04/2014   Consultants: PT/OT  Discharged Condition: Stable  Hospital Course: [name] is an 63 y.o. female who was admitted 02/04/2014 with a chief complaint of No chief complaint on file. , and found to have a diagnosis of <principal problem not specified>.  They were brought to the operating room on 02/04/2014 and underwent the above named procedures.    The patient had an uncomplicated hospital course and was stable for discharge.  Recent vital signs:  Filed Vitals:   02/07/14 0800  BP:   Pulse:   Temp:   Resp: 18    Recent laboratory studies:  Results for orders placed during the hospital encounter of 02/04/14  CBC      Result Value Ref Range   WBC 15.8 (*) 4.0 - 10.5 K/uL   RBC 3.92  3.87 - 5.11 MIL/uL   Hemoglobin 11.2 (*) 12.0 - 15.0 g/dL   HCT 40.934.6 (*) 81.136.0 - 91.446.0 %   MCV 88.3  78.0 - 100.0 fL   MCH 28.6  26.0 - 34.0 pg   MCHC 32.4  30.0 - 36.0 g/dL   RDW 78.214.5  95.611.5 - 21.315.5 %   Platelets 285  150 - 400 K/uL  CREATININE, SERUM      Result Value Ref Range   Creatinine, Ser 0.83  0.50 - 1.10 mg/dL   GFR calc non Af Amer 72 (*) >90 mL/min   GFR calc Af Amer 84 (*) >90 mL/min  PROTIME-INR      Result Value Ref Range   Prothrombin Time 14.9  11.6 - 15.2 seconds   INR 1.15  0.00 - 1.49  CBC      Result Value Ref Range   WBC 13.6 (*) 4.0 - 10.5 K/uL   RBC 3.80 (*) 3.87 - 5.11 MIL/uL   Hemoglobin 10.9 (*) 12.0 - 15.0 g/dL   HCT 08.633.7 (*) 57.836.0 - 46.946.0 %   MCV 88.7  78.0 - 100.0 fL   MCH 28.7  26.0 - 34.0 pg   MCHC 32.3  30.0 - 36.0 g/dL   RDW 62.914.5  52.811.5 - 41.315.5 %   Platelets 262  150 - 400 K/uL  BASIC METABOLIC PANEL      Result Value Ref  Range   Sodium 139  137 - 147 mEq/L   Potassium 4.2  3.7 - 5.3 mEq/L   Chloride 104  96 - 112 mEq/L   CO2 25  19 - 32 mEq/L   Glucose, Bld 118 (*) 70 - 99 mg/dL   BUN 14  6 - 23 mg/dL   Creatinine, Ser 2.440.81  0.50 - 1.10 mg/dL   Calcium 8.6  8.4 - 01.010.5 mg/dL   GFR calc non Af Amer 75 (*) >90 mL/min   GFR calc Af Amer 86 (*) >90 mL/min   Anion gap 10  5 - 15  PROTIME-INR      Result Value Ref Range   Prothrombin Time 17.3 (*) 11.6 - 15.2 seconds   INR 1.40  0.00 - 1.49  CBC      Result Value Ref Range   WBC 20.9 (*) 4.0 - 10.5 K/uL   RBC 3.74 (*) 3.87 - 5.11 MIL/uL  Hemoglobin 10.7 (*) 12.0 - 15.0 g/dL   HCT 60.432.8 (*) 54.036.0 - 98.146.0 %   MCV 87.7  78.0 - 100.0 fL   MCH 28.6  26.0 - 34.0 pg   MCHC 32.6  30.0 - 36.0 g/dL   RDW 19.114.2  47.811.5 - 29.515.5 %   Platelets 246  150 - 400 K/uL  PROTIME-INR      Result Value Ref Range   Prothrombin Time 18.7 (*) 11.6 - 15.2 seconds   INR 1.54 (*) 0.00 - 1.49  CBC      Result Value Ref Range   WBC 17.6 (*) 4.0 - 10.5 K/uL   RBC 3.64 (*) 3.87 - 5.11 MIL/uL   Hemoglobin 10.7 (*) 12.0 - 15.0 g/dL   HCT 62.132.7 (*) 30.836.0 - 65.746.0 %   MCV 89.8  78.0 - 100.0 fL   MCH 29.4  26.0 - 34.0 pg   MCHC 32.7  30.0 - 36.0 g/dL   RDW 84.614.0  96.211.5 - 95.215.5 %   Platelets 248  150 - 400 K/uL    Discharge Medications:     Medication List    STOP taking these medications       naproxen sodium 220 MG tablet  Commonly known as:  ANAPROX      TAKE these medications       methocarbamol 500 MG tablet  Commonly known as:  ROBAXIN  Take 1 tablet (500 mg total) by mouth 3 (three) times daily as needed.     oxyCODONE-acetaminophen 5-325 MG per tablet  Commonly known as:  ROXICET  Take 1-2 tablets by mouth every 4 (four) hours as needed for severe pain.     warfarin 5 MG tablet  Commonly known as:  COUMADIN  Take 1 tablet (5 mg total) by mouth daily.        Diagnostic Studies: Dg Chest 2 View  01/26/2014   CLINICAL DATA:  No chest complaints.  Preop right knee  replacement.  EXAM: CHEST  2 VIEW  COMPARISON:  12/02/2009  FINDINGS: There is mild bilateral interstitial prominence. There is no focal parenchymal opacity, pleural effusion, or pneumothorax. There is stable cardiomegaly.  The osseous structures are unremarkable.  IMPRESSION: 1. No active cardiopulmonary disease. 2. Stable cardiomegaly.   Electronically Signed   By: Elige KoHetal  Patel   On: 01/26/2014 15:24   Dg Knee Right Port  02/04/2014   CLINICAL DATA:  Right knee replacement.  EXAM: PORTABLE RIGHT KNEE - 1-2 VIEW  COMPARISON:  None.  FINDINGS: Patient status post total right knee replacement. Good anatomic alignment noted. No acute abnormality identified.  IMPRESSION: Patient status post total right knee replacement with good anatomic alignment .   Electronically Signed   By: Maisie Fushomas  Register   On: 02/04/2014 11:31    Disposition:         Follow-up Information   Follow up with NORRIS,STEVEN R, MD. Call in 2 weeks. [phone])    Specialty:  Orthopedic Surgery   Contact information:   [address] [place] [address] [phone]       [facility]. ([facility] will contact you concerning start date and time for physical therapy.)    Contact information:   [address] [place] [address] [phone]        Signed: Thea GistDIXON,Bassam Dresch B 02/07/2014, 9:21 AM

## 2014-02-07 NOTE — Progress Notes (Signed)
Physical Therapy Treatment Patient Details Name: [name] MRN: [id number] DOB: 10/04/1950 Today's Date: 02/07/2014    History of Present Illness Pt is a 63 y.o. female s/p Rt TKA; previous Lt TKA 4 years ago.     PT Comments    Continuing progress with amb, mobility, in particular gait distance;   OK for dc home from PT standpoint    Follow Up Recommendations  Home health PT;Supervision/Assistance - 24 hour     Equipment Recommendations  None recommended by PT    Recommendations for Other Services       Precautions / Restrictions Precautions Precautions: Knee Precaution Comments: reviewed no pillow under knee and wear of KI  Required Braces or Orthoses: Knee Immobilizer - Right;Other Brace/Splint Knee Immobilizer - Right: Other (comment) Other Brace/Splint: bone foam  Restrictions Weight Bearing Restrictions: No RLE Weight Bearing: Weight bearing as tolerated    Mobility  Bed Mobility                  Transfers Overall transfer level: Needs assistance Equipment used: Rolling walker (2 wheeled) Transfers: Sit to/from Stand Sit to Stand: Min guard;Supervision         General transfer comment: minguard progressing to supervision; occasional cues for hand placement and positioning RLE for comfort  Ambulation/Gait Ambulation/Gait assistance: Supervision Ambulation Distance (Feet): 110 Feet Assistive device: Rolling walker (2 wheeled) Gait Pattern/deviations: Step-to pattern;Trunk flexed Gait velocity: decreased   General Gait Details: Cues mostly for upright posture, especially with L foot advancement; no loss of balance, and managing household distances well   Stairs            Wheelchair Mobility    Modified Rankin (Stroke Patients Only)       Balance     Sitting balance-Leahy Scale: Good Sitting balance - Comments: sat EOB for exercises and No difficulty balancing without UE support     Standing balance-Leahy Scale: Fair                      Cognition Arousal/Alertness: Awake/alert Behavior During Therapy: WFL for tasks assessed/performed Overall Cognitive Status: Within Functional Limits for tasks assessed                      Exercises      General Comments        Pertinent Vitals/Pain Pain Assessment: 0-10 Pain Score: 2  Pain Location: R knee Pain Descriptors / Indicators: Sore Pain Intervention(s): Monitored during session    Home Living                      Prior Function            PT Goals (current goals can now be found in the care plan section) Acute Rehab PT Goals Patient Stated Goal: walk again today  PT Goal Formulation: With patient Time For Goal Achievement: 02/09/14 Potential to Achieve Goals: Good Progress towards PT goals: Progressing toward goals    Frequency  7X/week    PT Plan Current plan remains appropriate    Co-evaluation             End of Session Equipment Utilized During Treatment: Gait belt;Right knee immobilizer;Oxygen Activity Tolerance: Patient tolerated treatment well Patient left: with call bell/phone within reach;in bed;in CPM     Time: [id number] PT Time Calculation (min): 27 min  Charges:  $Gait Training: 23-37 mins  G Codes:      Van ClinesGarrigan, Zarius Furr Hamff 02/07/2014, 1:11 PM  Van ClinesHolly Kash [place], [place]PT  Acute Rehabilitation Services Pager [phone] Office [phone]

## 2014-02-08 ENCOUNTER — Encounter (HOSPITAL_COMMUNITY): Payer: Self-pay | Admitting: Orthopedic Surgery

## 2014-02-08 DIAGNOSIS — Z7901 Long term (current) use of anticoagulants: Secondary | ICD-10-CM | POA: Diagnosis not present

## 2014-02-08 DIAGNOSIS — R030 Elevated blood-pressure reading, without diagnosis of hypertension: Secondary | ICD-10-CM | POA: Diagnosis not present

## 2014-02-08 DIAGNOSIS — Z96651 Presence of right artificial knee joint: Secondary | ICD-10-CM | POA: Diagnosis not present

## 2014-02-08 DIAGNOSIS — R269 Unspecified abnormalities of gait and mobility: Secondary | ICD-10-CM | POA: Diagnosis not present

## 2014-02-08 DIAGNOSIS — Z471 Aftercare following joint replacement surgery: Secondary | ICD-10-CM | POA: Diagnosis not present

## 2014-02-08 DIAGNOSIS — Z5181 Encounter for therapeutic drug level monitoring: Secondary | ICD-10-CM | POA: Diagnosis not present

## 2014-02-09 DIAGNOSIS — Z471 Aftercare following joint replacement surgery: Secondary | ICD-10-CM | POA: Diagnosis not present

## 2014-02-09 DIAGNOSIS — Z96651 Presence of right artificial knee joint: Secondary | ICD-10-CM | POA: Diagnosis not present

## 2014-02-09 DIAGNOSIS — R030 Elevated blood-pressure reading, without diagnosis of hypertension: Secondary | ICD-10-CM | POA: Diagnosis not present

## 2014-02-09 DIAGNOSIS — Z7901 Long term (current) use of anticoagulants: Secondary | ICD-10-CM | POA: Diagnosis not present

## 2014-02-09 DIAGNOSIS — Z5181 Encounter for therapeutic drug level monitoring: Secondary | ICD-10-CM | POA: Diagnosis not present

## 2014-02-09 DIAGNOSIS — R269 Unspecified abnormalities of gait and mobility: Secondary | ICD-10-CM | POA: Diagnosis not present

## 2014-02-11 DIAGNOSIS — Z96651 Presence of right artificial knee joint: Secondary | ICD-10-CM | POA: Diagnosis not present

## 2014-02-11 DIAGNOSIS — Z471 Aftercare following joint replacement surgery: Secondary | ICD-10-CM | POA: Diagnosis not present

## 2014-02-11 DIAGNOSIS — Z5181 Encounter for therapeutic drug level monitoring: Secondary | ICD-10-CM | POA: Diagnosis not present

## 2014-02-11 DIAGNOSIS — R269 Unspecified abnormalities of gait and mobility: Secondary | ICD-10-CM | POA: Diagnosis not present

## 2014-02-11 DIAGNOSIS — R030 Elevated blood-pressure reading, without diagnosis of hypertension: Secondary | ICD-10-CM | POA: Diagnosis not present

## 2014-02-11 DIAGNOSIS — Z7901 Long term (current) use of anticoagulants: Secondary | ICD-10-CM | POA: Diagnosis not present

## 2014-02-14 DIAGNOSIS — Z7901 Long term (current) use of anticoagulants: Secondary | ICD-10-CM | POA: Diagnosis not present

## 2014-02-14 DIAGNOSIS — R030 Elevated blood-pressure reading, without diagnosis of hypertension: Secondary | ICD-10-CM | POA: Diagnosis not present

## 2014-02-14 DIAGNOSIS — Z5181 Encounter for therapeutic drug level monitoring: Secondary | ICD-10-CM | POA: Diagnosis not present

## 2014-02-14 DIAGNOSIS — R269 Unspecified abnormalities of gait and mobility: Secondary | ICD-10-CM | POA: Diagnosis not present

## 2014-02-14 DIAGNOSIS — Z471 Aftercare following joint replacement surgery: Secondary | ICD-10-CM | POA: Diagnosis not present

## 2014-02-14 DIAGNOSIS — Z96651 Presence of right artificial knee joint: Secondary | ICD-10-CM | POA: Diagnosis not present

## 2014-02-16 DIAGNOSIS — R269 Unspecified abnormalities of gait and mobility: Secondary | ICD-10-CM | POA: Diagnosis not present

## 2014-02-16 DIAGNOSIS — Z7901 Long term (current) use of anticoagulants: Secondary | ICD-10-CM | POA: Diagnosis not present

## 2014-02-16 DIAGNOSIS — Z471 Aftercare following joint replacement surgery: Secondary | ICD-10-CM | POA: Diagnosis not present

## 2014-02-16 DIAGNOSIS — Z5181 Encounter for therapeutic drug level monitoring: Secondary | ICD-10-CM | POA: Diagnosis not present

## 2014-02-16 DIAGNOSIS — Z96651 Presence of right artificial knee joint: Secondary | ICD-10-CM | POA: Diagnosis not present

## 2014-02-16 DIAGNOSIS — R030 Elevated blood-pressure reading, without diagnosis of hypertension: Secondary | ICD-10-CM | POA: Diagnosis not present

## 2014-02-17 DIAGNOSIS — Z96651 Presence of right artificial knee joint: Secondary | ICD-10-CM | POA: Diagnosis not present

## 2014-02-17 DIAGNOSIS — Z7901 Long term (current) use of anticoagulants: Secondary | ICD-10-CM | POA: Diagnosis not present

## 2014-02-17 DIAGNOSIS — R269 Unspecified abnormalities of gait and mobility: Secondary | ICD-10-CM | POA: Diagnosis not present

## 2014-02-17 DIAGNOSIS — Z5181 Encounter for therapeutic drug level monitoring: Secondary | ICD-10-CM | POA: Diagnosis not present

## 2014-02-17 DIAGNOSIS — R030 Elevated blood-pressure reading, without diagnosis of hypertension: Secondary | ICD-10-CM | POA: Diagnosis not present

## 2014-02-17 DIAGNOSIS — Z471 Aftercare following joint replacement surgery: Secondary | ICD-10-CM | POA: Diagnosis not present

## 2014-02-18 DIAGNOSIS — Z96651 Presence of right artificial knee joint: Secondary | ICD-10-CM | POA: Diagnosis not present

## 2014-02-18 DIAGNOSIS — Z471 Aftercare following joint replacement surgery: Secondary | ICD-10-CM | POA: Diagnosis not present

## 2014-02-18 DIAGNOSIS — R030 Elevated blood-pressure reading, without diagnosis of hypertension: Secondary | ICD-10-CM | POA: Diagnosis not present

## 2014-02-18 DIAGNOSIS — Z7901 Long term (current) use of anticoagulants: Secondary | ICD-10-CM | POA: Diagnosis not present

## 2014-02-18 DIAGNOSIS — Z5181 Encounter for therapeutic drug level monitoring: Secondary | ICD-10-CM | POA: Diagnosis not present

## 2014-02-18 DIAGNOSIS — R269 Unspecified abnormalities of gait and mobility: Secondary | ICD-10-CM | POA: Diagnosis not present

## 2014-02-21 DIAGNOSIS — M1711 Unilateral primary osteoarthritis, right knee: Secondary | ICD-10-CM | POA: Diagnosis not present

## 2014-02-21 DIAGNOSIS — Z5181 Encounter for therapeutic drug level monitoring: Secondary | ICD-10-CM | POA: Diagnosis not present

## 2014-02-25 DIAGNOSIS — M1711 Unilateral primary osteoarthritis, right knee: Secondary | ICD-10-CM | POA: Diagnosis not present

## 2014-03-01 DIAGNOSIS — M1711 Unilateral primary osteoarthritis, right knee: Secondary | ICD-10-CM | POA: Diagnosis not present

## 2014-03-04 DIAGNOSIS — M1711 Unilateral primary osteoarthritis, right knee: Secondary | ICD-10-CM | POA: Diagnosis not present

## 2014-03-07 DIAGNOSIS — M1711 Unilateral primary osteoarthritis, right knee: Secondary | ICD-10-CM | POA: Diagnosis not present

## 2014-03-15 DIAGNOSIS — M1711 Unilateral primary osteoarthritis, right knee: Secondary | ICD-10-CM | POA: Diagnosis not present

## 2014-03-17 DIAGNOSIS — M1711 Unilateral primary osteoarthritis, right knee: Secondary | ICD-10-CM | POA: Diagnosis not present

## 2014-04-28 DIAGNOSIS — M1711 Unilateral primary osteoarthritis, right knee: Secondary | ICD-10-CM | POA: Diagnosis not present

## 2014-04-28 DIAGNOSIS — Z96651 Presence of right artificial knee joint: Secondary | ICD-10-CM | POA: Diagnosis not present

## 2014-04-28 DIAGNOSIS — Z471 Aftercare following joint replacement surgery: Secondary | ICD-10-CM | POA: Diagnosis not present

## 2014-07-26 DIAGNOSIS — Z96651 Presence of right artificial knee joint: Secondary | ICD-10-CM | POA: Diagnosis not present

## 2014-07-26 DIAGNOSIS — Z471 Aftercare following joint replacement surgery: Secondary | ICD-10-CM | POA: Diagnosis not present

## 2014-11-24 ENCOUNTER — Other Ambulatory Visit: Payer: Self-pay

## 2014-11-24 DIAGNOSIS — Z1231 Encounter for screening mammogram for malignant neoplasm of breast: Secondary | ICD-10-CM

## 2014-11-30 ENCOUNTER — Ambulatory Visit (INDEPENDENT_AMBULATORY_CARE_PROVIDER_SITE_OTHER): Payer: Medicare Other | Admitting: Family Medicine

## 2014-11-30 ENCOUNTER — Encounter: Payer: Self-pay | Admitting: Family Medicine

## 2014-11-30 VITALS — BP 140/78 | HR 85 | Temp 98.1°F | Ht 63.0 in | Wt 270.5 lb

## 2014-11-30 DIAGNOSIS — E65 Localized adiposity: Secondary | ICD-10-CM

## 2014-11-30 DIAGNOSIS — L309 Dermatitis, unspecified: Secondary | ICD-10-CM

## 2014-11-30 DIAGNOSIS — E669 Obesity, unspecified: Secondary | ICD-10-CM

## 2014-11-30 MED ORDER — MOMETASONE FUROATE 0.1 % EX CREA: 1.0000 "application " | TOPICAL_CREAM | Freq: Every day | CUTANEOUS | Status: DC

## 2014-11-30 NOTE — Progress Notes (Signed)
Pre visit review using our clinic review tool, if applicable. No additional management support is needed unless otherwise documented below in the visit note. 

## 2014-11-30 NOTE — Assessment & Plan Note (Signed)
Morbidly obese with bmi of 47 Discussed how this problem influences overall health and the risks it imposes  Reviewed plan for weight loss with lower calorie diet (via better food choices and also portion control or program like weight watchers) and exercise building up to or more than 30 minutes 5 days per week including some aerobic activity    Now with knee repl done in the fall -can start some low impact exercise

## 2014-11-30 NOTE — Assessment & Plan Note (Signed)
Pt has these on upper arms and also over clavicles - slt larger on the L  BMI of 47 -morbidly obese  Recommend wt loss to help  Rev plan for gradual wt loss Will watch these areas for change

## 2014-11-30 NOTE — Progress Notes (Signed)
Subjective:    Patient ID: [name], female    DOB: June 26, 1950, 64 y.o.   MRN: [id number]  HPI Here for a place on neck Noticed a week ago  Itched - and she scratched it  Felt like a "water pocket"   Is no longer itching  No recollection of an insect bites - but a mosquito bite is possible  No other skin lesions No ticks found  No rash   Feels ok overall   Has gained wt  She had her 2nd knee repl in the fall   Patient Active Problem List   Diagnosis Date Noted  . Arthritis of knee, right 02/04/2014  . Pre-operative cardiovascular examination 12/06/2013  . History of anemia 12/06/2013  . Elevated BP 12/06/2013  . History of pulmonary embolism 01/02/2010  . DRY SKIN 06/01/2007   Past Medical History  Diagnosis Date  . Allergic rhinitis   . History of pulmonary embolus (PE) 11/2009    post-op LLL PE  . Family history of diabetes mellitus   . Other specified congenital anomaly of skin   . Peripheral vascular disease   . Urinary frequency   . Osteoarthritis     "both knees" (02/04/2014)   Past Surgical History  Procedure Laterality Date  . Knee arthroscopy Bilateral X 3    "2 on one side; one on the other"  . Uterine biopsy      RE: fibroid tumors  . Total knee arthroplasty Left 11/2009    coumadin x 6 months  . Tonsillectomy    . Total knee arthroplasty Right 02/04/2014  . Joint replacement    . Tubal ligation  1983  . Total knee arthroplasty Right 02/04/2014    Procedure: RIGHT TOTAL KNEE ARTHROPLASTY;  Surgeon: Verlee Rossetti, MD;  [facility];  Service: Orthopedics;  Laterality: Right;   Social History  Substance Use Topics  . Smoking status: Never Smoker   . Smokeless tobacco: Never Used  . Alcohol Use: 0.0 oz/week    0 Standard drinks or equivalent per week     Comment: 02/04/2014 "maybe once/month I'll have a mixed drink or ffruit lavored drink"   Family History  Problem Relation Age of Onset  . Cervical cancer Mother   . Diabetes type II  Mother   . Hiatal hernia Mother   . Coronary artery disease Father     CABG  . Diabetes Father   . Cancer Father     bladder  . Arrhythmia Father   . Gout Father   . Osteoarthritis Paternal Grandmother   . Cancer Paternal Grandfather   . Diabetes Paternal Grandfather   . Parkinsonism Maternal Grandmother   . Other Maternal Grandfather     "fluid in his lungs"   Allergies  Allergen Reactions  . Penicillins Swelling   No current outpatient prescriptions on file prior to visit.   No current facility-administered medications on file prior to visit.      Review of Systems Review of Systems  Constitutional: Negative for fever, appetite change, fatigue and unexpected weight change.  Eyes: Negative for pain and visual disturbance.  Respiratory: Negative for cough and shortness of breath.   Cardiovascular: Negative for cp or palpitations    Gastrointestinal: Negative for nausea, diarrhea and constipation.  Genitourinary: Negative for urgency and frequency.  Skin: Negative for pallor and pos for itchy skin / ? Rash on neck  Neurological: Negative for weakness, light-headedness, numbness and headaches.  MSK pos for bilat  knee swelling and soreness (s/p knee replacements) Hematological: Negative for adenopathy. Does not bruise/bleed easily.  Psychiatric/Behavioral: Negative for dysphoric mood. The patient is not nervous/anxious.         Objective:   Physical Exam  Constitutional: She appears well-developed and well-nourished. No distress.  Morbidly obese and well appearing   HENT:  Head: Normocephalic and atraumatic.  Mouth/Throat: Oropharynx is clear and moist.  Eyes: Conjunctivae and EOM are normal. Pupils are equal, round, and reactive to light. Right eye exhibits no discharge. Left eye exhibits no discharge. No scleral icterus.  Neck: Normal range of motion. Neck supple. No JVD present. Carotid bruit is not present. No tracheal deviation present. No thyromegaly present.    Cardiovascular: Normal rate and regular rhythm.   Pulmonary/Chest: Effort normal and breath sounds normal. No respiratory distress. She has no wheezes. She has no rales.  Musculoskeletal: She exhibits no edema.  Lymphadenopathy:    She has no cervical adenopathy.  Neurological: She is alert. She has normal reflexes.  Skin: Skin is warm and dry. Rash noted. No pallor.  Small area (quarter size) of scale and scant erythema on L side of neck above clavicle  Also fat deposition above both clavicles -slt bigger on the R     Psychiatric: She has a normal mood and affect.          Assessment & Plan:   Problem List Items Addressed This Visit    Dermatitis - Primary    On skin - over clavicle , with area of fatty deposit)  Unsure of trigger or even if heat plays a role  rec using scent free cleansers/products  Elocon prn for itch  Update if not starting to improve in a week or if worsening        Localized deposits of fat    Pt has these on upper arms and also over clavicles - slt larger on the L  BMI of 47 -morbidly obese  Recommend wt loss to help  Rev plan for gradual wt loss Will watch these areas for change        Obesity    Morbidly obese with bmi of 47 Discussed how this problem influences overall health and the risks it imposes  Reviewed plan for weight loss with lower calorie diet (via better food choices and also portion control or program like weight watchers) and exercise building up to or more than 30 minutes 5 days per week including some aerobic activity    Now with knee repl done in the fall -can start some low impact exercise

## 2014-11-30 NOTE — Patient Instructions (Addendum)
I think the fullness in L neck is a fatty deposit  Work on weight loss with diet and exercise  For the itchy area (dermatitis) try elocon cream once daily - if this worsens or does not improve please let me know   Goal for exercise is 5 days per week - work up to 30 minutes (and eventually more)

## 2014-11-30 NOTE — Assessment & Plan Note (Signed)
On skin - over clavicle , with area of fatty deposit)  Unsure of trigger or even if heat plays a role  rec using scent free cleansers/products  Elocon prn for itch  Update if not starting to improve in a week or if worsening

## 2014-12-30 ENCOUNTER — Ambulatory Visit
Admission: RE | Admit: 2014-12-30 | Discharge: 2014-12-30 | Disposition: A | Discharge status: Home / Self Care | Payer: Medicare Other | Source: Ambulatory Visit

## 2014-12-30 DIAGNOSIS — Z1231 Encounter for screening mammogram for malignant neoplasm of breast: Secondary | ICD-10-CM

## 2014-12-30 LAB — HM MAMMOGRAPHY: HM Mammogram: NORMAL

## 2015-01-03 ENCOUNTER — Encounter: Payer: Self-pay | Admitting: *Deleted

## 2015-01-03 ENCOUNTER — Encounter: Payer: Self-pay | Admitting: Family Medicine

## 2015-02-07 DIAGNOSIS — Z96653 Presence of artificial knee joint, bilateral: Secondary | ICD-10-CM | POA: Diagnosis not present

## 2015-02-07 DIAGNOSIS — Z471 Aftercare following joint replacement surgery: Secondary | ICD-10-CM | POA: Diagnosis not present

## 2015-12-19 ENCOUNTER — Other Ambulatory Visit: Payer: Self-pay

## 2016-01-15 ENCOUNTER — Other Ambulatory Visit: Payer: Self-pay | Admitting: Family Medicine

## 2016-01-15 DIAGNOSIS — Z1231 Encounter for screening mammogram for malignant neoplasm of breast: Secondary | ICD-10-CM

## 2016-01-24 ENCOUNTER — Ambulatory Visit
Admission: RE | Admit: 2016-01-24 | Discharge: 2016-01-24 | Disposition: A | Discharge status: Home / Self Care | Payer: Medicare Other | Source: Ambulatory Visit | Attending: Family Medicine | Admitting: Family Medicine

## 2016-01-24 DIAGNOSIS — Z1231 Encounter for screening mammogram for malignant neoplasm of breast: Secondary | ICD-10-CM | POA: Diagnosis not present

## 2016-01-24 LAB — HM MAMMOGRAPHY

## 2016-01-30 ENCOUNTER — Encounter: Payer: Self-pay | Admitting: *Deleted

## 2016-07-05 DIAGNOSIS — H43813 Vitreous degeneration, bilateral: Secondary | ICD-10-CM | POA: Diagnosis not present

## 2016-07-05 DIAGNOSIS — H2512 Age-related nuclear cataract, left eye: Secondary | ICD-10-CM | POA: Diagnosis not present

## 2016-07-05 DIAGNOSIS — H25013 Cortical age-related cataract, bilateral: Secondary | ICD-10-CM | POA: Diagnosis not present

## 2016-07-17 ENCOUNTER — Telehealth: Payer: Self-pay | Admitting: Family Medicine

## 2016-07-17 NOTE — Telephone Encounter (Signed)
Pt will call back to schedule AWV °

## 2016-08-16 NOTE — Telephone Encounter (Signed)
Pt will call back to schedule at the end of May.

## 2017-01-08 ENCOUNTER — Other Ambulatory Visit: Payer: Self-pay | Admitting: Family Medicine

## 2017-01-08 DIAGNOSIS — Z1231 Encounter for screening mammogram for malignant neoplasm of breast: Secondary | ICD-10-CM

## 2017-01-24 ENCOUNTER — Ambulatory Visit: Payer: Medicare Other

## 2017-02-12 ENCOUNTER — Ambulatory Visit
Admission: RE | Admit: 2017-02-12 | Discharge: 2017-02-12 | Disposition: A | Discharge status: Home / Self Care | Payer: Medicare Other | Source: Ambulatory Visit | Attending: Family Medicine | Admitting: Family Medicine

## 2017-02-12 DIAGNOSIS — Z1231 Encounter for screening mammogram for malignant neoplasm of breast: Secondary | ICD-10-CM

## 2017-09-01 DIAGNOSIS — H43813 Vitreous degeneration, bilateral: Secondary | ICD-10-CM | POA: Diagnosis not present

## 2017-09-01 DIAGNOSIS — H2512 Age-related nuclear cataract, left eye: Secondary | ICD-10-CM | POA: Diagnosis not present

## 2017-09-01 DIAGNOSIS — H16223 Keratoconjunctivitis sicca, not specified as Sjogren's, bilateral: Secondary | ICD-10-CM | POA: Diagnosis not present

## 2017-09-01 DIAGNOSIS — H25013 Cortical age-related cataract, bilateral: Secondary | ICD-10-CM | POA: Diagnosis not present

## 2017-11-25 ENCOUNTER — Ambulatory Visit (INDEPENDENT_AMBULATORY_CARE_PROVIDER_SITE_OTHER): Payer: Medicare Other | Admitting: Family

## 2017-11-25 ENCOUNTER — Ambulatory Visit (INDEPENDENT_AMBULATORY_CARE_PROVIDER_SITE_OTHER)
Admission: RE | Admit: 2017-11-25 | Discharge: 2017-11-25 | Disposition: A | Discharge status: Home / Self Care | Payer: Medicare Other | Source: Ambulatory Visit | Attending: Family | Admitting: Family

## 2017-11-25 ENCOUNTER — Encounter: Payer: Self-pay | Admitting: Family

## 2017-11-25 VITALS — BP 168/98 | HR 97 | Temp 97.6°F | Ht 63.0 in | Wt 279.1 lb

## 2017-11-25 DIAGNOSIS — M79642 Pain in left hand: Secondary | ICD-10-CM

## 2017-11-25 DIAGNOSIS — M19032 Primary osteoarthritis, left wrist: Secondary | ICD-10-CM | POA: Diagnosis not present

## 2017-11-25 MED ORDER — RANITIDINE HCL 150 MG PO TABS: 150.0000 mg | ORAL_TABLET | Freq: Two times a day (BID) | ORAL | 0 refills | Status: DC

## 2017-11-25 MED ORDER — CETIRIZINE HCL 10 MG PO TABS: 10.0000 mg | ORAL_TABLET | Freq: Every day | ORAL | 11 refills | Status: DC

## 2017-11-25 MED ORDER — METHYLPREDNISOLONE ACETATE 40 MG/ML IJ SUSP
40.0000 mg | Freq: Once | INTRAMUSCULAR | Status: AC
  Administered 2017-11-25: 40 mg via INTRAMUSCULAR

## 2017-11-25 NOTE — Patient Instructions (Signed)
Please schedule a follow-up with your PCP to re-establish care/ follow-up on your blood pressure

## 2017-11-25 NOTE — Progress Notes (Signed)
[name] is a 67 y.o. female with the following history as recorded in EpicCare:  Patient Active Problem List   Diagnosis Date Noted  . Dermatitis 11/30/2014  . Localized deposits of fat 11/30/2014  . Obesity 11/30/2014  . Arthritis of knee, right 02/04/2014  . History of anemia 12/06/2013  . Elevated BP 12/06/2013  . History of pulmonary embolism 01/02/2010    Current Outpatient Medications  Medication Sig Dispense Refill  . cetirizine (ZYRTEC) 10 MG tablet Take 1 tablet (10 mg total) by mouth daily. 30 tablet 11  . ranitidine (ZANTAC) 150 MG tablet Take 1 tablet (150 mg total) by mouth 2 (two) times daily. 30 tablet 0   No current facility-administered medications for this visit.     Allergies: Penicillins  Past Medical History:  Diagnosis Date  . Allergic rhinitis   . Family history of diabetes mellitus   . History of pulmonary embolus (PE) 11/2009   post-op LLL PE  . Osteoarthritis    "both knees" (02/04/2014)  . Other specified congenital anomaly of skin   . Peripheral vascular disease (HCC)   . Urinary frequency     Past Surgical History:  Procedure Laterality Date  . JOINT REPLACEMENT    . KNEE ARTHROSCOPY Bilateral X 3   "2 on one side; one on the other"  . TONSILLECTOMY    . 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 TOTAL KNEE ARTHROPLASTY Right 02/04/2014  . TOTAL KNEE ARTHROPLASTY Right 02/04/2014   Procedure: RIGHT TOTAL KNEE ARTHROPLASTY;  Surgeon: Verlee RossettiSteven R Norris, MD;  Location[facility] OR;  Service: Orthopedics;  Laterality: Right;  . TUBAL LIGATION  1983  . Uterine Biopsy     RE: fibroid tumors    Family History  Problem Relation Age of Onset  . Cervical cancer Mother   . Diabetes type II Mother   . Hiatal hernia Mother   . Coronary artery disease Father        CABG  . Diabetes Father   . Cancer Father        bladder  . Arrhythmia Father   . Gout Father   . Osteoarthritis Paternal Grandmother   . Cancer Paternal  Grandfather   . Diabetes Paternal Grandfather   . Parkinsonism Maternal Grandmother   . Other Maternal Grandfather        "fluid in his lungs"  . Breast cancer Neg Hx     Social History   Tobacco Use  . Smoking status: Never Smoker  . Smokeless tobacco: Never Used  Substance Use Topics  . Alcohol use: Yes    Alcohol/week: 0.0 standard drinks    Comment: 02/04/2014 "maybe once/month I'll have a mixed drink or ffruit lavored drink"    Subjective:  Patient is accompanied by her husband today with concerns for sudden onset of localized area of swelling at base of left hand; started after working in her yard last night but does not remember being bitten by any type of insect; was not wearing gloves; no recent falls/ no injury to the hand or wrist; has not taken any medication for symptom relief; does feel that fingertips are "cold" since symptoms started; Has not seen her PCP in almost 3 years; no prior history of HTN- does not check regularly but notes she does get nervous in MD office; Denies any chest pain, shortness of breath, blurred vision; admits to having occasional headaches.     Objective:  Vitals:  11/25/17 1127  BP: (!) 168/98  Pulse: 97  Temp: 97.6 F (36.4 C)  TempSrc: Oral  SpO2: 95%  Weight: 279 lb 1.3 oz (126.6 kg)  Height: 5\' 3"  (1.6 m)    General: Well developed, well nourished, in no acute distress  Skin : Warm and dry.  Head: Normocephalic and atraumatic  Lungs: Respirations unlabored; clear to auscultation bilaterally without wheeze, rales, rhonchi  CVS exam: normal rate and regular rhythm.  Musculoskeletal: No deformities; localized area of swelling noted at base of left hand; FROM on active and passive motion; Extremities: No edema, clubbing; fingertips do have slight bluish appearance but nails indicate good cap refill; Vessels: Symmetric bilaterally  Neurologic: Alert and oriented; speech intact; face symmetrical; moves all extremities well; CNII-XII  intact without focal deficit   1. Pain of left hand     Plan:  STAT X-ray of wrist and hand do not show any type of fracture; based on appearance, will treat for localized allergic reaction with Depo-Medrol IM 40, Zyrtec and Zantac; strict ER precautions discussed if swelling persists/ discoloration becomes more prominent in fingertips- she and husband understand that would be concerning for circulatory issue; Have asked her to try and see her PCP in follow-up in 1-2 days and re-check her blood pressure; she is welcome to come here if unable to be seen at her PCP's office;    No follow-ups on file.  Orders Placed This Encounter  Procedures  . DG Hand Complete Left    Standing Status:   Future    Number of Occurrences:   1    Standing Expiration Date:   01/26/2019    Order Specific Question:   Reason for Exam (SYMPTOM  OR DIAGNOSIS REQUIRED)    Answer:   left hand pain/ sudden onset swelling    Order Specific Question:   Preferred imaging location?    Answer:   [address]    Order Specific Question:   Radiology Contrast Protocol - do NOT remove file path    Answer:   \\[email or web address]  . DG Wrist Complete Left    Standing Status:   Future    Number of Occurrences:   1    Standing Expiration Date:   01/26/2019    Order Specific Question:   Reason for Exam (SYMPTOM  OR DIAGNOSIS REQUIRED)    Answer:   wrist pain    Order Specific Question:   Preferred imaging location?    Answer:   [address]    Order Specific Question:   Radiology Contrast Protocol - do NOT remove file path    Answer:   \\[email or web address]    Requested Prescriptions   Signed Prescriptions Disp Refills  . cetirizine (ZYRTEC) 10 MG tablet 30 tablet 11    Sig: Take 1 tablet (10 mg total) by mouth daily.  . ranitidine (ZANTAC) 150 MG tablet 30 tablet 0    Sig: Take 1 tablet (150 mg total) by mouth 2 (two) times daily.

## 2017-11-27 ENCOUNTER — Ambulatory Visit: Payer: Self-pay | Admitting: Family Medicine

## 2017-11-27 ENCOUNTER — Emergency Department (HOSPITAL_COMMUNITY)
Admission: EM | Admit: 2017-11-27 | Discharge: 2017-11-27 | Disposition: A | Discharge status: Home / Self Care | Payer: Medicare Other | Attending: Emergency Medicine | Admitting: Emergency Medicine

## 2017-11-27 ENCOUNTER — Encounter (HOSPITAL_COMMUNITY): Payer: Self-pay

## 2017-11-27 ENCOUNTER — Other Ambulatory Visit: Payer: Self-pay

## 2017-11-27 DIAGNOSIS — L03114 Cellulitis of left upper limb: Secondary | ICD-10-CM | POA: Diagnosis not present

## 2017-11-27 DIAGNOSIS — Z96653 Presence of artificial knee joint, bilateral: Secondary | ICD-10-CM | POA: Diagnosis not present

## 2017-11-27 DIAGNOSIS — Z79899 Other long term (current) drug therapy: Secondary | ICD-10-CM | POA: Insufficient documentation

## 2017-11-27 DIAGNOSIS — R2232 Localized swelling, mass and lump, left upper limb: Secondary | ICD-10-CM | POA: Diagnosis present

## 2017-11-27 MED ORDER — DOXYCYCLINE HYCLATE 100 MG PO TABS
100.0000 mg | ORAL_TABLET | Freq: Once | ORAL | Status: AC
  Administered 2017-11-27: 100 mg via ORAL
  Filled 2017-11-27: qty 1

## 2017-11-27 MED ORDER — DOXYCYCLINE HYCLATE 100 MG PO CAPS: 100.0000 mg | ORAL_CAPSULE | Freq: Two times a day (BID) | ORAL | 0 refills | Status: DC

## 2017-11-27 NOTE — Telephone Encounter (Signed)
Aware, agree

## 2017-11-27 NOTE — ED Provider Notes (Signed)
[facility] EMERGENCY DEPARTMENT Provider Note   CSN: [id number] Arrival date & time: 11/27/17  1248     History   Chief Complaint Chief Complaint  Patient presents with  . Arm Swelling    HPI [name] is a 67 y.o. female for evaluation of left hand swelling.  Patient states that for days ago, she noticed swelling and pain of her left hand.  She was seen at her primary care office, given prednisone and started on antihistamines.  She reports symptoms initially improved, but returned again today.  She reports soreness, swelling, and itching of her radial left hand.  She denies numbness or tingling.  She denies fall, trauma, or injury.  She denies known insect bite.  No extension past the wrist.  She denies history of similar.  She denies symptoms elsewhere.  She denies other medical problems, takes no medications daily.  She denies pain or swelling in her forearm or upper arm.  Denies fevers, chills, chest pain, shortness breath, nausea, vomiting, abdominal pain.  Additional history obtained from chart review, patient with a history of PE.  No history of hypertension.  Is not currently on any hypertension medications.  HPI  Past Medical History:  Diagnosis Date  . Allergic rhinitis   . Family history of diabetes mellitus   . History of pulmonary embolus (PE) 11/2009   post-op LLL PE  . Osteoarthritis    "both knees" (02/04/2014)  . Other specified congenital anomaly of skin   . Peripheral vascular disease (HCC)   . Urinary frequency     Patient Active Problem List   Diagnosis Date Noted  . Dermatitis 11/30/2014  . Localized deposits of fat 11/30/2014  . Obesity 11/30/2014  . Arthritis of knee, right 02/04/2014  . History of anemia 12/06/2013  . Elevated BP 12/06/2013  . History of pulmonary embolism 01/02/2010    Past Surgical History:  Procedure Laterality Date  . JOINT REPLACEMENT    . KNEE ARTHROSCOPY Bilateral X 3   "2 on one side; one on  the other"  . TONSILLECTOMY    . 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 TOTAL KNEE ARTHROPLASTY Right 02/04/2014  . TOTAL KNEE ARTHROPLASTY Right 02/04/2014   Procedure: RIGHT TOTAL KNEE ARTHROPLASTY;  Surgeon: Verlee Rossetti, MD;  Location: [facility] OR;  Service: Orthopedics;  Laterality: Right;  . TUBAL LIGATION  1983  . Uterine Biopsy     RE: fibroid tumors     OB History   None      Home Medications    Prior to Admission medications   Medication Sig Start Date End Date Taking? Authorizing Provider  cetirizine (ZYRTEC) 10 MG tablet Take 1 tablet (10 mg total) by mouth daily. 11/25/17   Olive Bass, FNP  doxycycline (VIBRAMYCIN) 100 MG capsule Take 1 capsule (100 mg total) by mouth 2 (two) times daily. 11/27/17   Araiya Tilmon, PA-C  ranitidine (ZANTAC) 150 MG tablet Take 1 tablet (150 mg total) by mouth 2 (two) times daily. 11/25/17   Olive Bass, FNP    Family History Family History  Problem Relation Age of Onset  . Cervical cancer Mother   . Diabetes type II Mother   . Hiatal hernia Mother   . Coronary artery disease Father        CABG  . Diabetes Father   . Cancer Father        bladder  . Arrhythmia Father   .  Gout Father   . Osteoarthritis Paternal Grandmother   . Cancer Paternal Grandfather   . Diabetes Paternal Grandfather   . Parkinsonism Maternal Grandmother   . Other Maternal Grandfather        "fluid in his lungs"  . Breast cancer Neg Hx     Social History Social History   Tobacco Use  . Smoking status: Never Smoker  . Smokeless tobacco: Never Used  Substance Use Topics  . Alcohol use: Yes    Alcohol/week: 0.0 standard drinks    Comment: 02/04/2014 "maybe once/month I'll have a mixed drink or ffruit lavored drink"  . Drug use: No     Allergies   Penicillins   Review of Systems Review of Systems  Musculoskeletal: Positive for joint swelling.  Skin: Positive for color change.  Neurological:  Negative for numbness.  All other systems reviewed and are negative.    Physical Exam Updated Vital Signs BP (!) 142/69   Pulse 89   Temp 98.1 F (36.7 C) (Oral)   Resp 16   SpO2 97%   Physical Exam  Constitutional: She is oriented to person, place, and time. She appears well-developed and well-nourished. No distress.  Resting comfortably in the bed in no acute distress.  HENT:  Head: Normocephalic and atraumatic.  Eyes: Pupils are equal, round, and reactive to light. Conjunctivae and EOM are normal.  Neck: Normal range of motion. Neck supple.  Cardiovascular: Normal rate, regular rhythm and intact distal pulses.  Pulmonary/Chest: Effort normal and breath sounds normal. No respiratory distress. She has no wheezes.  Abdominal: Soft. She exhibits no distension. There is no tenderness.  Musculoskeletal: She exhibits edema and tenderness.  Neurological: She is alert and oriented to person, place, and time. No sensory deficit.  Skin: Skin is warm and dry. Capillary refill takes less than 2 seconds. There is erythema.  Erythema, warmth, and swelling of the radial aspect of the left hand.  No extension past the wrist, and no extension into the fingers.  Good radial pulses.  Grip strength intact bilaterally.  Good cap refill of distal fingers.  Full active range of motion of fingers without pain.  Decreased range of motion of the wrist due to pain.  No erythema, warmth, swelling, or pain of the forearm.  Negative Tinel's.  Psychiatric: She has a normal mood and affect.  Nursing note and vitals reviewed.      ED Treatments / Results  Labs (all labs ordered are listed, but only abnormal results are displayed) Labs Reviewed - No data to display  EKG None  Radiology No results found.  Procedures Procedures (including critical care time)  Medications Ordered in ED Medications  doxycycline (VIBRA-TABS) tablet 100 mg (100 mg Oral Given 11/27/17 1455)     Initial Impression /  Assessment and Plan / ED Course  I have reviewed the triage vital signs and the nursing notes.  Pertinent labs & imaging results that were available during my care of the patient were reviewed by me and considered in my medical decision making (see chart for details).     Patient presenting for evaluation of pain and swelling of the radial aspect of the left hand.  Physical exam reassuring, she is neurovascularly intact.  No tachycardia, patient is afebrile.  Appears nontoxic.  Initially, blood pressure was elevated, but this resolved without intervention.  On exam, patient has erythema, warmth, and swelling of the radial aspect of her left hand, concern for cellulitis.  Able to move her  wrist minimally, and no circumferential erythema.  No fevers or chills, doubt septic joint. Good radial pulse and good cap refill, doubt vascular injury.  No extension into the forearm or upper arm, doubt DVT.  Assessed with bedside ultrasound, which did not show any significant abscess.  Discussed findings with patient.  Discussed treatment with antibiotics.  Patient has an appointment with her PCP tomorrow afternoon, will have hand reassessed at that time.  At this time, patient appears safe for discharge.  Return precautions given.  Patient states she understands and agrees plan.  Final Clinical Impressions(s) / ED Diagnoses   Final diagnoses:  Cellulitis of left upper extremity    ED Discharge Orders         Ordered    doxycycline (VIBRAMYCIN) 100 MG capsule  2 times daily     11/27/17 1427           Makella Buckingham, PA-C 11/27/17 1500    Pricilla LovelessGoldston, Scott, MD 11/27/17 2240

## 2017-11-27 NOTE — ED Triage Notes (Signed)
Pt states Monday night she noted swelling on her left hand as well as soreness. She states her PCP was treating her for possible spider bite. Pt states swelling improved some but now she has swelling in her hand again.

## 2017-11-27 NOTE — Telephone Encounter (Signed)
Pt was seen in the office on 11/25/17 for  Swelling at the base of her left hand. Pt stated she was working in the yard without gloves on and does not think she was bitten by a insect. Pt was given a shot of Depo-Medrol and an Rx for Zyrtec and Zantac, an xray and instructions to go to the ED if swelling persists.  Pt called back today c/o with worsening swelling from the base of her wrist to the knuckles. Pt stated her finger tips are cool, but she has had an ice pack on.  Pt advised to go to the ED for further evaluation.  Reason for Disposition . [1] Small area of LOCALIZED swelling AND [2] not better after 3 days    OV notes advised to have pt go to the ED for worsening swelling or discoloration.  Additional Information . Negative: MODERATE hand swelling (e.g., visible swelling of hand and fingers; pitting edema)    Pt advised to go to the ED for worsening swelling per office notes 11/25/17  Answer Assessment - Initial Assessment Questions 1. ONSET: "When did the swelling start?" (e.g., minutes, hours, days)     11/25/17 2. LOCATION: "What part of the hand is swollen?"  "Are both hands swollen or just one hand?"     From the wrist to knuckles 3. SEVERITY: "How bad is the swelling?" (e.g., localized; mild, moderate, severe)   - BALL OR LUMP: small ball or lump   - LOCALIZED: puffy or swollen area or patch of skin   - JOINT SWELLING: swelling of a joint   - MILD: puffiness or mild swelling of fingers or hand   - MODERATE: fingers and hand are swollen   - SEVERE: swelling of entire hand and up into forearm     moderate 4. REDNESS: "Does the swelling look red or infected?"     no 5. PAIN: "Is the swelling painful to touch?" If so, ask: "How painful is it?"   (Scale 1-10; mild, moderate or severe)     no 6. FEVER: "Do you have a fever?" If so, ask: "What is it, how was it measured, and when did it start?"      no 7. CAUSE: "What do you think is causing the hand swelling?" (e.g., heat,  insect bite, pregnancy, recent injury)  questionable insect bite but pt doesn't remember that she was bitten 8. MEDICAL HISTORY: "Do you have a history of heart failure, kidney disease, liver failure, or cancer?"     no 9. RECURRENT SYMPTOM: "Have you had hand swelling before?" If so, ask: "When was the last time?" "What happened that time?"     no 10. OTHER SYMPTOMS: "Do you have any other symptoms?" (e.g., blurred vision, difficulty breathing, headache)       no 11. PREGNANCY: "Is there any chance you are pregnant?" "When was your last menstrual period?"       n/a  Protocols used: [facility]

## 2017-11-27 NOTE — ED Provider Notes (Signed)
Patient placed in Quick Look pathway, seen and evaluated   Chief Complaint: let hand swelling  HPI:   [name] is a 67 y.o. female who presents to the ED with left hand swelling and pain. The symptoms started 4 days ago. She went to her PCP 2 days ago and treated for possible spider bite. Patient was treated in the office with Steroid injection and d/c home with Zyrtec and Zantac. The swelling seemed better for a while but then today is worse.   ROS:   Physical Exam:  BP (!) 202/90 (BP Location: Right Arm)   Pulse 90   Temp 98.1 F (36.7 C) (Oral)   Resp 18   SpO2 100%    Gen: No distress, afebrile  Neuro: Awake and Alert  Skin: Warm and dry  M/S: swelling to the left hand including the fingers and extending to the wrist. Mildly tender on exam. No red streaking     Initiation of care has begun. The patient has been counseled on the process, plan, and necessity for staying for the completion/evaluation, and the remainder of the medical screening examination    Janne Napoleoneese, Theoden Mauch M, NP 11/27/17 1325    Loren RacerYelverton, David, MD 11/28/17 [phone]

## 2017-11-27 NOTE — Discharge Instructions (Addendum)
Take antibiotics as prescribed.  Start the antibiotic tonight. Continue taking your other medications as prescribed.   Follow-up with your doctor tomorrow to schedule appointment and for reevaluation of your hand. Use Tylenol, ibuprofen, and ice to help with pain and swelling.  Keep your arm elevated when able. Return to the emergency room if you develop high fevers, persistent vomiting, severe pain, or any new or concerning symptoms.

## 2017-11-28 ENCOUNTER — Encounter: Payer: Self-pay | Admitting: Family Medicine

## 2017-11-28 ENCOUNTER — Ambulatory Visit (INDEPENDENT_AMBULATORY_CARE_PROVIDER_SITE_OTHER): Payer: Medicare Other | Admitting: Family Medicine

## 2017-11-28 VITALS — BP 156/98 | HR 90 | Temp 98.2°F | Ht 63.0 in | Wt 276.0 lb

## 2017-11-28 DIAGNOSIS — L03119 Cellulitis of unspecified part of limb: Secondary | ICD-10-CM | POA: Diagnosis not present

## 2017-11-28 DIAGNOSIS — I10 Essential (primary) hypertension: Secondary | ICD-10-CM

## 2017-11-28 MED ORDER — HYDROCHLOROTHIAZIDE 25 MG PO TABS: 25.0000 mg | ORAL_TABLET | Freq: Every day | ORAL | 11 refills | Status: DC

## 2017-11-28 NOTE — Progress Notes (Signed)
Subjective:    Patient ID: [name], female    DOB: 09/28/1950, 67 y.o.   MRN: [id number]  HPI  Here for concerns about blood pressure   Wt Readings from Last 3 Encounters:  11/28/17 276 lb (125.2 kg)  11/25/17 279 lb 1.3 oz (126.6 kg)  11/30/14 270 lb 8 oz (122.7 kg)   48.89 kg/m   She [facility] cellul[facility] of [facility] and started on doxycycline Had a bedside US which did not show abscess or DVT  Her bp at the hospital was 202/90  Had originally been seen by Dr Dayton ScrapeMurray - thought it was an allergic txn to insect bite and was given steroid shot (xray was ok)   Arm is improving - swelling is starting to go down  Feet swell at times  BP Readings from Last 3 Encounters:  11/28/17 (!) 156/98  11/27/17 (!) 142/69  11/25/17 (!) 168/98   BP is still elevated   fam hx  Father had CAD-? If he had HTN   No visit in 3 y here  Had both knees replaced  No complaints  Likes to walk when it is not too hot   Not eating healthy at all  Husband is diabetic -was going to work on it together  Got off track  She does eat vegetables   Lab Results  Component Value Date   CREATININE 0.81 02/05/2014   BUN 14 02/05/2014   NA 139 02/05/2014   K 4.2 02/05/2014   CL 104 02/05/2014   CO2 25 02/05/2014    Patient Active Problem List   Diagnosis Date Noted  . Essential hypertension 11/28/2017  . Cellulitis of hand 11/28/2017  . Dermatitis 11/30/2014  . Localized deposits of fat 11/30/2014  . Obesity 11/30/2014  . Arthritis of knee, right 02/04/2014  . History of anemia 12/06/2013  . Elevated BP 12/06/2013  . History of pulmonary embolism 01/02/2010   Past Medical History:  Diagnosis Date  . Allergic rhinitis   . Family history of diabetes mellitus   . History of pulmonary embolus (PE) 11/2009   post-op LLL PE  . Osteoarthritis    "both knees" (02/04/2014)  . Other specified congenital anomaly of skin   . Peripheral vascular disease (HCC)   . Urinary  frequency    Past Surgical History:  Procedure Laterality Date  . JOINT REPLACEMENT    . KNEE ARTHROSCOPY Bilateral X 3   "2 on one side; one on the other"  . TONSILLECTOMY    . 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 TOTAL KNEE ARTHROPLASTY Right 02/04/2014  . TOTAL KNEE ARTHROPLASTY Right 02/04/2014   Procedure: RIGHT TOTAL KNEE ARTHROPLASTY;  Surgeon: Verlee RossettiSteven R Norris, MD;  [facility];  Service: Orthopedics;  Laterality: Right;  . TUBAL LIGATION  1983  . Uterine Biopsy     RE: fibroid tumors   Social History   Tobacco Use  . Smoking status: Never Smoker  . Smokeless tobacco: Never Used  Substance Use Topics  . Alcohol use: Yes    Alcohol/week: 0.0 standard drinks    Comment: 02/04/2014 "maybe once/month I'll have a mixed drink or ffruit lavored drink"  . Drug use: No   Family History  Problem Relation Age of Onset  . Cervical cancer Mother   . Diabetes type II Mother   . Hiatal hernia Mother   . Coronary artery disease Father  CABG  . Diabetes Father   . Cancer Father        bladder  . Arrhythmia Father   . Gout Father   . Osteoarthritis Paternal Grandmother   . Cancer Paternal Grandfather   . Diabetes Paternal Grandfather   . Parkinsonism Maternal Grandmother   . Other Maternal Grandfather        "fluid in his lungs"  . Breast cancer Neg Hx    Allergies  Allergen Reactions  . Penicillins Swelling   Current Outpatient Medications on File Prior to Visit  Medication Sig Dispense Refill  . cetirizine (ZYRTEC) 10 MG tablet Take 1 tablet (10 mg total) by mouth daily. 30 tablet 11  . doxycycline (VIBRAMYCIN) 100 MG capsule Take 1 capsule (100 mg total) by mouth 2 (two) times daily. 20 capsule 0  . ranitidine (ZANTAC) 150 MG tablet Take 1 tablet (150 mg total) by mouth 2 (two) times daily. 30 tablet 0   No current facility-administered medications on file prior to visit.     Review of Systems  Constitutional: Negative for  activity change, appetite change, fatigue, fever and unexpected weight change.  HENT: Negative for congestion, ear pain, rhinorrhea, sinus pressure and sore throat.   Eyes: Negative for pain, redness and visual disturbance.  Respiratory: Negative for cough, shortness of breath and wheezing.   Cardiovascular: Negative for chest pain and palpitations.  Gastrointestinal: Negative for abdominal pain, blood in stool, constipation and diarrhea.  Endocrine: Negative for polydipsia and polyuria.  Genitourinary: Negative for dysuria, frequency and urgency.  Musculoskeletal: Negative for arthralgias, back pain and myalgias.  Skin: Negative for pallor and rash.       L hand redness and swelling  Allergic/Immunologic: Negative for environmental allergies.  Neurological: Negative for dizziness, syncope and headaches.  Hematological: Negative for adenopathy. Does not bruise/bleed easily.  Psychiatric/Behavioral: Negative for decreased concentration and dysphoric mood. The patient is not nervous/anxious.        Objective:   Physical Exam  Constitutional: She appears well-developed and well-nourished. No distress.  obese and well appearing   HENT:  Head: Normocephalic and atraumatic.  Mouth/Throat: Oropharynx is clear and moist.  Eyes: Pupils are equal, round, and reactive to light. Conjunctivae and EOM are normal.  Neck: Normal range of motion. Neck supple. No JVD present. Carotid bruit is not present. No thyromegaly present.  Cardiovascular: Normal rate, regular rhythm, normal heart sounds and intact distal pulses. Exam reveals no gallop.  Pulmonary/Chest: Effort normal and breath sounds normal. No respiratory distress. She has no wheezes. She has no rales.  No crackles  Abdominal: Soft. Bowel sounds are normal. She exhibits no distension, no abdominal bruit and no mass. There is no tenderness.  Musculoskeletal: She exhibits no edema.  Lymphadenopathy:    She has no cervical adenopathy.    Neurological: She is alert. She has normal reflexes. She displays normal reflexes. She exhibits normal muscle tone. Coordination normal.  Skin: Skin is warm and dry. No rash noted. There is erythema.  Diffuse swelling of L hand (mostly dorsal)  Erythema over lateral hand/thenar area to wrist (mild) No streaking  Mild tenderness Nl rom of fingers/wrist  Nl perfusion and sensation  Psychiatric: She has a normal mood and affect.  Pleasant           Assessment & Plan:   Problem List Items Addressed This Visit      Cardiovascular and Mediastinum   Essential hypertension - Primary    bp has been up for  last 3 readings (very high in ED) Morbidly obese  BP Readings from Last 1 Encounters:  11/28/17 (!) 156/98   Will begin tx- HCTZ 25 mg daily (disc poss side effects)  Most recent labs reviewed  Disc lifstyle change with low sodium diet and exercise  Labs ordered Handout for DASH eating plan and HTN given  Follow up in 2 weeks        Relevant Medications   hydrochlorothiazide (HYDRODIURIL) 25 MG tablet   Other Relevant Orders   CBC with Differential/Platelet (Completed)   Comprehensive metabolic panel (Completed)   Lipid panel (Completed)   TSH (Completed)     Other   Cellulitis of hand    L hand -diagnosed with cellulitis in ED Reviewed hospital records, lab results and studies in detail   May have initially started with allergic reaction  On day 2 of doxycycline -finish this  Mild improvement  Update if not starting to improve in a week or if worsening   Continue to elevate and use warm or cool compress      Morbid obesity (HCC)    Discussed how this problem influences overall health and the risks it imposes  Reviewed plan for weight loss with lower calorie diet (via better food choices and also portion control or program like weight watchers) and exercise building up to or more than 30 minutes 5 days per week including some aerobic activity

## 2017-11-28 NOTE — Patient Instructions (Addendum)
For indoor exercise- there are great videos  [employer] has walking exercise videos for indoors   Take a look at the [facility] diet  Try to get most of your carbohydrates from produce (with the exception of white potatoes)  Eat less bread/pasta/rice/snack foods/cereals/sweets and other items from the middle of the grocery store (processed carbs)  Weight loss also helps blood pressure  Start the HCTZ 25 mg  Take 1 pill each am  If any side effects-stop it and call us   We will get some baseline labs today

## 2017-11-29 LAB — COMPREHENSIVE METABOLIC PANEL
AG Ratio: 1.2 (calc) (ref 1.0–2.5)
ALT: 9 U/L (ref 6–29)
AST: 12 U/L (ref 10–35)
Albumin: 3.9 g/dL (ref 3.6–5.1)
Alkaline phosphatase (APISO): 109 U/L (ref 33–130)
BUN: 15 mg/dL (ref 7–25)
CO2: 25 mmol/L (ref 20–32)
Calcium: 9.2 mg/dL (ref 8.6–10.4)
Chloride: 103 mmol/L (ref 98–110)
Creat: 0.86 mg/dL (ref 0.50–0.99)
Globulin: 3.2 g/dL (calc) (ref 1.9–3.7)
Glucose, Bld: 113 mg/dL — ABNORMAL HIGH (ref 65–99)
Potassium: 4.3 mmol/L (ref 3.5–5.3)
Sodium: 140 mmol/L (ref 135–146)
Total Bilirubin: 1 mg/dL (ref 0.2–1.2)
Total Protein: 7.1 g/dL (ref 6.1–8.1)

## 2017-11-29 LAB — CBC WITH DIFFERENTIAL/PLATELET
Basophils Absolute: 57 cells/uL (ref 0–200)
Basophils Relative: 0.5 %
Eosinophils Absolute: 399 cells/uL (ref 15–500)
Eosinophils Relative: 3.5 %
HCT: 41.1 % (ref 35.0–45.0)
Hemoglobin: 13.6 g/dL (ref 11.7–15.5)
Lymphs Abs: 2542 cells/uL (ref 850–3900)
MCH: 28.8 pg (ref 27.0–33.0)
MCHC: 33.1 g/dL (ref 32.0–36.0)
MCV: 87.1 fL (ref 80.0–100.0)
MPV: 10.2 fL (ref 7.5–12.5)
Monocytes Relative: 7.7 %
Neutro Abs: 7524 cells/uL (ref 1500–7800)
Neutrophils Relative %: 66 %
Platelets: 358 10*3/uL (ref 140–400)
RBC: 4.72 10*6/uL (ref 3.80–5.10)
RDW: 12.7 % (ref 11.0–15.0)
Total Lymphocyte: 22.3 %
WBC mixed population: 878 cells/uL (ref 200–950)
WBC: 11.4 10*3/uL — ABNORMAL HIGH (ref 3.8–10.8)

## 2017-11-29 LAB — TSH: TSH: 5.03 mIU/L — ABNORMAL HIGH (ref 0.40–4.50)

## 2017-11-29 LAB — LIPID PANEL
Cholesterol: 157 mg/dL (ref <200)
HDL: 52 mg/dL (ref >50)
LDL Cholesterol (Calc): 90 mg/dL (calc)
Non-HDL Cholesterol (Calc): 105 mg/dL (calc) (ref <130)
Total CHOL/HDL Ratio: 3 (calc) (ref <5.0)
Triglycerides: 63 mg/dL (ref <150)

## 2017-11-29 NOTE — Assessment & Plan Note (Addendum)
L hand -diagnosed with cellulitis in ED Reviewed hospital records, lab results and studies in detail   May have initially started with allergic reaction  On day 2 of doxycycline -finish this  Mild improvement  Update if not starting to improve in a week or if worsening   Continue to elevate and use warm or cool compress

## 2017-11-29 NOTE — Assessment & Plan Note (Signed)
Discussed how this problem influences overall health and the risks it imposes  Reviewed plan for weight loss with lower calorie diet (via better food choices and also portion control or program like weight watchers) and exercise building up to or more than 30 minutes 5 days per week including some aerobic activity    

## 2017-11-29 NOTE — Assessment & Plan Note (Signed)
bp has been up for last 3 readings (very high in ED) Morbidly obese  BP Readings from Last 1 Encounters:  11/28/17 (!) 156/98   Will begin tx- HCTZ 25 mg daily (disc poss side effects)  Most recent labs reviewed  Disc lifstyle change with low sodium diet and exercise  Labs ordered Handout for DASH eating plan and HTN given  Follow up in 2 weeks

## 2017-12-02 ENCOUNTER — Encounter: Payer: Self-pay | Admitting: *Deleted

## 2017-12-16 ENCOUNTER — Encounter: Payer: Self-pay | Admitting: Family Medicine

## 2017-12-16 ENCOUNTER — Ambulatory Visit (INDEPENDENT_AMBULATORY_CARE_PROVIDER_SITE_OTHER): Payer: Medicare Other | Admitting: Family Medicine

## 2017-12-16 VITALS — BP 126/78 | HR 79 | Temp 98.1°F | Ht 63.0 in | Wt 271.5 lb

## 2017-12-16 DIAGNOSIS — R7309 Other abnormal glucose: Secondary | ICD-10-CM | POA: Diagnosis not present

## 2017-12-16 DIAGNOSIS — E038 Other specified hypothyroidism: Secondary | ICD-10-CM

## 2017-12-16 DIAGNOSIS — E039 Hypothyroidism, unspecified: Secondary | ICD-10-CM | POA: Diagnosis not present

## 2017-12-16 DIAGNOSIS — I10 Essential (primary) hypertension: Secondary | ICD-10-CM | POA: Diagnosis not present

## 2017-12-16 LAB — BASIC METABOLIC PANEL
BUN: 14 mg/dL (ref 6–23)
CO2: 32 mEq/L (ref 19–32)
Calcium: 9.6 mg/dL (ref 8.4–10.5)
Chloride: 97 mEq/L (ref 96–112)
Creatinine, Ser: 0.91 mg/dL (ref 0.40–1.20)
GFR: 65.1 mL/min (ref >60.00)
Glucose, Bld: 105 mg/dL — ABNORMAL HIGH (ref 70–99)
Potassium: 3.6 mEq/L (ref 3.5–5.1)
Sodium: 139 mEq/L (ref 135–145)

## 2017-12-16 LAB — T4, FREE: Free T4: 0.79 ng/dL (ref 0.60–1.60)

## 2017-12-16 LAB — HEMOGLOBIN A1C: Hgb A1c MFr Bld: 6.2 % (ref 4.6–6.5)

## 2017-12-16 NOTE — Assessment & Plan Note (Signed)
Random glucose 116 in obese pt  Will check A1C today

## 2017-12-16 NOTE — Assessment & Plan Note (Signed)
bp in fair control at this time  BP Readings from Last 1 Encounters:  12/16/17 126/78   No changes needed Most recent labs reviewed  Disc lifstyle change with low sodium diet and exercise  Continue hctz  Labs today for medication effect  Strongly enc wt loss and also f/u for health mt (pt is unsure if she wants health mt visit)

## 2017-12-16 NOTE — Progress Notes (Signed)
Subjective:    Patient ID: [name], female    DOB: 10/25/1950, 67 y.o.   MRN: [id number]  HPI Here for 2 wk f/u of chronic health problems    Wt Readings from Last 3 Encounters:  12/16/17 271 lb 8 oz (123.2 kg)  11/28/17 276 lb (125.2 kg)  11/25/17 279 lb 1.3 oz (126.6 kg)  wt is down 5 lb  48.09 kg/m   Doing a lot of walking  Eating less  Eating more salads and vegetables    bp is stable today  No cp or palpitations or headaches or edema  No side effects to medicines  BP Readings from Last 3 Encounters:  12/16/17 126/78  11/28/17 (!) 156/98  11/27/17 (!) 142/69    Last visit started hctz 25 mg daily  No side effects  Given handout for DASH eating and HTN   Results for orders placed or performed in visit on 11/28/17  CBC with Differential/Platelet  Result Value Ref Range   WBC 11.4 (H) 3.8 - 10.8 Thousand/uL   RBC 4.72 3.80 - 5.10 Million/uL   Hemoglobin 13.6 11.7 - 15.5 g/dL   HCT 82.9 56.2 - 13.0 %   MCV 87.1 80.0 - 100.0 fL   MCH 28.8 27.0 - 33.0 pg   MCHC 33.1 32.0 - 36.0 g/dL   RDW 86.5 78.4 - 69.6 %   Platelets 358 140 - 400 Thousand/uL   MPV 10.2 7.5 - 12.5 fL   Neutro Abs 7,524 1,500 - 7,800 cells/uL   Lymphs Abs 2,542 850 - 3,900 cells/uL   WBC mixed population 878 200 - 950 cells/uL   Eosinophils Absolute 399 15 - 500 cells/uL   Basophils Absolute 57 0 - 200 cells/uL   Neutrophils Relative % 66 %   Total Lymphocyte 22.3 %   Monocytes Relative 7.7 %   Eosinophils Relative 3.5 %   Basophils Relative 0.5 %  Comprehensive metabolic panel  Result Value Ref Range   Glucose, Bld 113 (H) 65 - 99 mg/dL   BUN 15 7 - 25 mg/dL   Creat 2.95 2.84 - 1.32 mg/dL   BUN/Creatinine Ratio NOT APPLICABLE 6 - 22 (calc)   Sodium 140 135 - 146 mmol/L   Potassium 4.3 3.5 - 5.3 mmol/L   Chloride 103 98 - 110 mmol/L   CO2 25 20 - 32 mmol/L   Calcium 9.2 8.6 - 10.4 mg/dL   Total Protein 7.1 6.1 - 8.1 g/dL   Albumin 3.9 3.6 - 5.1 g/dL   Globulin 3.2 1.9 - 3.7  g/dL (calc)   AG Ratio 1.2 1.0 - 2.5 (calc)   Total Bilirubin 1.0 0.2 - 1.2 mg/dL   Alkaline phosphatase (APISO) 109 33 - 130 U/L   AST 12 10 - 35 U/L   ALT 9 6 - 29 U/L  Lipid panel  Result Value Ref Range   Cholesterol 157 <200 mg/dL   HDL 52 >44 mg/dL   Triglycerides 63 <010 mg/dL   LDL Cholesterol (Calc) 90 mg/dL (calc)   Total CHOL/HDL Ratio 3.0 <5.0 (calc)   Non-HDL Cholesterol (Calc) 105 <130 mg/dL (calc)  TSH  Result Value Ref Range   TSH 5.03 (H) 0.40 - 4.50 mIU/L    TSH slt high Glucose non fasting  Wbc was high due to cellulitis at last visit    Also had cellulitis of L hand  Took course of doxycycline  Much better  Also zyrtec for itching (no more itching)   Patient  Active Problem List   Diagnosis Date Noted  . Elevated random blood glucose level 12/16/2017  . Subclinical hypothyroidism 12/16/2017  . Morbid obesity (HCC) 11/29/2017  . Essential hypertension 11/28/2017  . Cellulitis of hand 11/28/2017  . Dermatitis 11/30/2014  . Localized deposits of fat 11/30/2014  . Arthritis of knee, right 02/04/2014  . History of anemia 12/06/2013  . Elevated BP 12/06/2013  . History of pulmonary embolism 01/02/2010   Past Medical History:  Diagnosis Date  . Allergic rhinitis   . Family history of diabetes mellitus   . History of pulmonary embolus (PE) 11/2009   post-op LLL PE  . Osteoarthritis    "both knees" (02/04/2014)  . Other specified congenital anomaly of skin   . Peripheral vascular disease (HCC)   . Urinary frequency    Past Surgical History:  Procedure Laterality Date  . JOINT REPLACEMENT    . KNEE ARTHROSCOPY Bilateral X 3   "2 on one side; one on the other"  . TONSILLECTOMY    . 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 TOTAL KNEE ARTHROPLASTY Right 02/04/2014  . TOTAL KNEE ARTHROPLASTY Right 02/04/2014   Procedure: RIGHT TOTAL KNEE ARTHROPLASTY;  Surgeon: Verlee Rossetti, MD;  Location: [facility] OR;  Service: Orthopedics;   Laterality: Right;  . TUBAL LIGATION  1983  . Uterine Biopsy     RE: fibroid tumors   Social History   Tobacco Use  . Smoking status: Never Smoker  . Smokeless tobacco: Never Used  Substance Use Topics  . Alcohol use: Yes    Alcohol/week: 0.0 standard drinks    Comment: 02/04/2014 "maybe once/month I'll have a mixed drink or ffruit lavored drink"  . Drug use: No   Family History  Problem Relation Age of Onset  . Cervical cancer Mother   . Diabetes type II Mother   . Hiatal hernia Mother   . Coronary artery disease Father        CABG  . Diabetes Father   . Cancer Father        bladder  . Arrhythmia Father   . Gout Father   . Osteoarthritis Paternal Grandmother   . Cancer Paternal Grandfather   . Diabetes Paternal Grandfather   . Parkinsonism Maternal Grandmother   . Other Maternal Grandfather        "fluid in his lungs"  . Breast cancer Neg Hx    Allergies  Allergen Reactions  . Penicillins Swelling   Current Outpatient Medications on File Prior to Visit  Medication Sig Dispense Refill  . hydrochlorothiazide (HYDRODIURIL) 25 MG tablet Take 1 tablet (25 mg total) by mouth daily. 30 tablet 11   No current facility-administered medications on file prior to visit.      Review of Systems  Constitutional: Negative for activity change, appetite change, fatigue, fever and unexpected weight change.  HENT: Negative for congestion, ear pain, rhinorrhea, sinus pressure and sore throat.   Eyes: Negative for pain, redness and visual disturbance.  Respiratory: Negative for cough, shortness of breath and wheezing.   Cardiovascular: Negative for chest pain and palpitations.  Gastrointestinal: Negative for abdominal pain, blood in stool, constipation and diarrhea.  Endocrine: Negative for polydipsia and polyuria.  Genitourinary: Negative for dysuria, frequency and urgency.  Musculoskeletal: Negative for arthralgias, back pain and myalgias.  Skin: Negative for pallor and rash.    Allergic/Immunologic: Negative for environmental allergies.  Neurological: Negative for dizziness, syncope and headaches.  Hematological: Negative for adenopathy.  Does not bruise/bleed easily.  Psychiatric/Behavioral: Negative for decreased concentration and dysphoric mood. The patient is not nervous/anxious.        Objective:   Physical Exam  Constitutional: She appears well-developed and well-nourished. No distress.  obese and well appearing   HENT:  Head: Normocephalic and atraumatic.  Mouth/Throat: Oropharynx is clear and moist.  Eyes: Pupils are equal, round, and reactive to light. Conjunctivae and EOM are normal.  Neck: Normal range of motion. Neck supple. No JVD present. Carotid bruit is not present. No thyromegaly present.  Cardiovascular: Normal rate, regular rhythm, normal heart sounds and intact distal pulses. Exam reveals no gallop.  Pulmonary/Chest: Effort normal and breath sounds normal. No respiratory distress. She has no wheezes. She has no rales.  No crackles  Abdominal: Soft. Bowel sounds are normal. She exhibits no distension, no abdominal bruit and no mass. There is no tenderness.  Musculoskeletal: She exhibits no edema.  Lymphadenopathy:    She has no cervical adenopathy.  Neurological: She is alert. She has normal reflexes. She displays normal reflexes. No cranial nerve deficit. Coordination normal.  Skin: Skin is warm and dry. No rash noted. No erythema. No pallor.  Hand cellulitis is resolved   Psychiatric: She has a normal mood and affect.          Assessment & Plan:   Problem List Items Addressed This Visit      Cardiovascular and Mediastinum   Essential hypertension - Primary    bp in fair control at this time  BP Readings from Last 1 Encounters:  12/16/17 126/78   No changes needed Most recent labs reviewed  Disc lifstyle change with low sodium diet and exercise  Continue hctz  Labs today for medication effect  Strongly enc wt loss and  also f/u for health mt (pt is unsure if she wants health mt visit)      Relevant Orders   Basic metabolic panel (Completed)     Endocrine   Subclinical hypothyroidism    Lab Results  Component Value Date   TSH 5.03 (H) 11/28/2017   Free T4 today No symptoms      Relevant Orders   T4, free (Completed)     Other   Elevated random blood glucose level    Random glucose 116 in obese pt  Will check A1C today      Relevant Orders   Hemoglobin A1c (Completed)   Morbid obesity (HCC)    Discussed how this problem influences overall health and the risks it imposes  Reviewed plan for weight loss with lower calorie diet (via better food choices and also portion control or program like weight watchers) and exercise building up to or more than 30 minutes 5 days per week including some aerobic activity   Commended on another 5 lb loss Disc low glycemic diet with regular exercise

## 2017-12-16 NOTE — Assessment & Plan Note (Signed)
Discussed how this problem influences overall health and the risks it imposes  Reviewed plan for weight loss with lower calorie diet (via better food choices and also portion control or program like weight watchers) and exercise building up to or more than 30 minutes 5 days per week including some aerobic activity   Commended on another 5 lb loss Disc low glycemic diet with regular exercise

## 2017-12-16 NOTE — Patient Instructions (Signed)
Labs today for your blood pressure medicine  Also for a diabetes screening test and thyroid test  Blood pressure is better  Keep taking good care of yourself and loosing weight  Try to exercise 30 minutes per day

## 2017-12-16 NOTE — Assessment & Plan Note (Signed)
Lab Results  Component Value Date   TSH 5.03 (H) 11/28/2017   Free T4 today No symptoms

## 2018-01-12 ENCOUNTER — Other Ambulatory Visit: Payer: Self-pay | Admitting: Family Medicine

## 2018-01-12 DIAGNOSIS — Z1231 Encounter for screening mammogram for malignant neoplasm of breast: Secondary | ICD-10-CM

## 2018-02-13 ENCOUNTER — Ambulatory Visit
Admission: RE | Admit: 2018-02-13 | Discharge: 2018-02-13 | Disposition: A | Discharge status: Home / Self Care | Payer: Medicare Other | Source: Ambulatory Visit | Attending: Family Medicine | Admitting: Family Medicine

## 2018-02-13 DIAGNOSIS — Z1231 Encounter for screening mammogram for malignant neoplasm of breast: Secondary | ICD-10-CM

## 2018-05-12 ENCOUNTER — Ambulatory Visit (INDEPENDENT_AMBULATORY_CARE_PROVIDER_SITE_OTHER): Payer: Medicare Other | Admitting: Family Medicine

## 2018-05-12 ENCOUNTER — Encounter: Payer: Self-pay | Admitting: Family Medicine

## 2018-05-12 VITALS — BP 130/82 | HR 93 | Temp 98.3°F | Ht 63.0 in

## 2018-05-12 DIAGNOSIS — J208 Acute bronchitis due to other specified organisms: Secondary | ICD-10-CM

## 2018-05-12 DIAGNOSIS — I10 Essential (primary) hypertension: Secondary | ICD-10-CM

## 2018-05-12 MED ORDER — BENZONATATE 200 MG PO CAPS: 200.0000 mg | ORAL_CAPSULE | Freq: Three times a day (TID) | ORAL | 1 refills | Status: DC | PRN

## 2018-05-12 MED ORDER — PREDNISONE 20 MG PO TABS: ORAL_TABLET | ORAL | 0 refills | Status: DC

## 2018-05-12 NOTE — Patient Instructions (Signed)
Try mucinex dm for cough and congestion  Drink lots of water  Take low dose prednisone as directed (you are wheezing a little)  Get rest when you need it  Try tessalon for cough also   Update if not starting to improve in a week or if worsening

## 2018-05-12 NOTE — Assessment & Plan Note (Signed)
With persistent cough  slt wheeze on exam  Px low dose prednisone  Tessalon Expectorant-DM otc as needed Update if not starting to improve in a week or if worsening

## 2018-05-12 NOTE — Assessment & Plan Note (Signed)
bp in fair control at this time  BP Readings from Last 1 Encounters:  05/12/18 130/82   No changes needed Most recent labs reviewed  Disc lifstyle change with low sodium diet and exercise

## 2018-05-12 NOTE — Assessment & Plan Note (Signed)
Encouraged wt loss 

## 2018-05-12 NOTE — Progress Notes (Signed)
Subjective:    Patient ID: [name], female    DOB: 10/04/1950, 68 y.o.   MRN: [id number]  HPI Here for cough and nasal congestion   This started with a cold  First one was early December (3 weeks)  Got better and then caught another one 2 wk ago  Not as severe   Persistent cough (comes in spells) Prod of mucous - phlegm is mostly clear  Some rattling in chest  No wheezing and not short of breath  No headache or facial pain  Ears - feel stopped up since first uri  Nasal d/c - clear as well   No fever  Does not feel bad No pain  No ST   Otc: flonase nasal spray  Took nyquil at night  Day quil severe cold and flu - gave her diarrhea/she stopped it   She has taken mucinex in the past (not this time)    Husband has had the flu    She does not get the flu shot -declines it  Has a 672 y old grand daughter   bp is up today  BP Readings from Last 3 Encounters:  05/12/18 (!) 146/82  12/16/17 126/78  11/28/17 (!) 156/98   Re check beter 130/82   Wt Readings from Last 3 Encounters:  12/16/17 271 lb 8 oz (123.2 kg)  11/28/17 276 lb (125.2 kg)  11/25/17 279 lb 1.3 oz (126.6 kg)   48.09 kg/m   Patient Active Problem List   Diagnosis Date Noted  . Viral bronchitis 05/12/2018  . Elevated random blood glucose level 12/16/2017  . Subclinical hypothyroidism 12/16/2017  . Morbid obesity (HCC) 11/29/2017  . Essential hypertension 11/28/2017  . Localized deposits of fat 11/30/2014  . Arthritis of knee, right 02/04/2014  . History of anemia 12/06/2013  . History of pulmonary embolism 01/02/2010   Past Medical History:  Diagnosis Date  . Allergic rhinitis   . Family history of diabetes mellitus   . History of pulmonary embolus (PE) 11/2009   post-op LLL PE  . Osteoarthritis    "both knees" (02/04/2014)  . Other specified congenital anomaly of skin   . Peripheral vascular disease (HCC)   . Urinary frequency    Past Surgical History:  Procedure Laterality Date   . JOINT REPLACEMENT    . KNEE ARTHROSCOPY Bilateral X 3   "2 on one side; one on the other"  . TONSILLECTOMY    . 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 TOTAL KNEE ARTHROPLASTY Right 02/04/2014  . TOTAL KNEE ARTHROPLASTY Right 02/04/2014   Procedure: RIGHT TOTAL KNEE ARTHROPLASTY;  Surgeon: Verlee RossettiSteven R Norris, MD;  [facility];  Service: Orthopedics;  Laterality: Right;  . TUBAL LIGATION  1983  . Uterine Biopsy     RE: fibroid tumors   Social History   Tobacco Use  . Smoking status: Never Smoker  . Smokeless tobacco: Never Used  Substance Use Topics  . Alcohol use: Yes    Alcohol/week: 0.0 standard drinks    Comment: 02/04/2014 "maybe once/month I'll have a mixed drink or ffruit lavored drink"  . Drug use: No   Family History  Problem Relation Age of Onset  . Cervical cancer Mother   . Diabetes type II Mother   . Hiatal hernia Mother   . Coronary artery disease Father        CABG  . Diabetes Father   . Cancer Father  bladder  . Arrhythmia Father   . Gout Father   . Osteoarthritis Paternal Grandmother   . Cancer Paternal Grandfather   . Diabetes Paternal Grandfather   . Parkinsonism Maternal Grandmother   . Other Maternal Grandfather        "fluid in his lungs"  . Breast cancer Neg Hx    Allergies  Allergen Reactions  . Penicillins Swelling   Current Outpatient Medications on File Prior to Visit  Medication Sig Dispense Refill  . hydrochlorothiazide (HYDRODIURIL) 25 MG tablet Take 1 tablet (25 mg total) by mouth daily. 30 tablet 11   No current facility-administered medications on file prior to visit.     Review of Systems  Constitutional: Positive for appetite change and fatigue. Negative for fever.  HENT: Positive for congestion, postnasal drip, rhinorrhea, sinus pressure, sneezing and sore throat. Negative for ear pain.   Eyes: Negative for pain and discharge.  Respiratory: Positive for cough and chest tightness. Negative for  shortness of breath, wheezing and stridor.   Cardiovascular: Negative for chest pain.  Gastrointestinal: Negative for diarrhea, nausea and vomiting.  Genitourinary: Negative for frequency, hematuria and urgency.  Musculoskeletal: Negative for arthralgias and myalgias.  Skin: Negative for rash.  Neurological: Positive for headaches. Negative for dizziness, weakness and light-headedness.  Psychiatric/Behavioral: Negative for confusion and dysphoric mood.       Objective:   Physical Exam Constitutional:      General: She is not in acute distress.    Appearance: Normal appearance. She is well-developed. She is obese. She is not ill-appearing, toxic-appearing or diaphoretic.  HENT:     Head: Normocephalic and atraumatic.     Comments: Nares are injected and congested      Right Ear: Tympanic membrane, ear canal and external ear normal.     Left Ear: Tympanic membrane, ear canal and external ear normal.     Nose: Congestion and rhinorrhea present.     Mouth/Throat:     Mouth: Mucous membranes are moist.     Pharynx: Oropharynx is clear. No oropharyngeal exudate or posterior oropharyngeal erythema.     Comments: Clear pnd  Eyes:     General:        Right eye: No discharge.        Left eye: No discharge.     Conjunctiva/sclera: Conjunctivae normal.     Pupils: Pupils are equal, round, and reactive to light.  Neck:     Musculoskeletal: Normal range of motion and neck supple.  Cardiovascular:     Rate and Rhythm: Normal rate.     Heart sounds: Normal heart sounds.  Pulmonary:     Effort: Pulmonary effort is normal. No respiratory distress.     Breath sounds: No stridor. Wheezing present. No rhonchi or rales.     Comments: Good air exch Hacking cough  Mild exp wheeze (overall good air exch)  No rales  Chest:     Chest wall: No tenderness.  Lymphadenopathy:     Cervical: No cervical adenopathy.  Skin:    General: Skin is warm and dry.     Capillary Refill: Capillary refill takes  less than 2 seconds.     Findings: No rash.  Neurological:     Mental Status: She is alert.     Cranial Nerves: No cranial nerve deficit.  Psychiatric:        Mood and Affect: Mood normal.           Assessment & Plan:   Problem  List Items Addressed This Visit      Cardiovascular and Mediastinum   Essential hypertension    bp in fair control at this time  BP Readings from Last 1 Encounters:  05/12/18 130/82   No changes needed Most recent labs reviewed  Disc lifstyle change with low sodium diet and exercise          Respiratory   Viral bronchitis - Primary    With persistent cough  slt wheeze on exam  Px low dose prednisone  Tessalon Expectorant-DM otc as needed Update if not starting to improve in a week or if worsening          Other   Morbid obesity (HCC)    Encouraged wt loss

## 2018-10-19 DIAGNOSIS — H43813 Vitreous degeneration, bilateral: Secondary | ICD-10-CM | POA: Diagnosis not present

## 2018-10-19 DIAGNOSIS — H25013 Cortical age-related cataract, bilateral: Secondary | ICD-10-CM | POA: Diagnosis not present

## 2018-10-19 DIAGNOSIS — H2513 Age-related nuclear cataract, bilateral: Secondary | ICD-10-CM | POA: Diagnosis not present

## 2018-10-21 ENCOUNTER — Telehealth: Payer: Self-pay | Admitting: Family Medicine

## 2018-10-21 MED ORDER — HYDROCHLOROTHIAZIDE 25 MG PO TABS: 25.0000 mg | ORAL_TABLET | Freq: Every day | ORAL | 0 refills | Status: DC

## 2018-10-21 NOTE — Telephone Encounter (Signed)
Best number [phone] Pt called stating [facility] has been trying to get a refill on  Hydrochlorothiazide  90 day supply

## 2018-10-21 NOTE — Telephone Encounter (Signed)
Med refilled once  

## 2019-01-13 ENCOUNTER — Other Ambulatory Visit: Payer: Self-pay | Admitting: Family Medicine

## 2019-01-13 NOTE — Telephone Encounter (Signed)
No recent or future appts., please advise  

## 2019-01-13 NOTE — Telephone Encounter (Signed)
Please schedule PE and refill until then  Thanks  

## 2019-01-14 NOTE — Telephone Encounter (Signed)
I spoke to patient and she's out of town.  Patient said she'll call when she gets back to schedule cpx.

## 2019-01-14 NOTE — Telephone Encounter (Signed)
Med refilled once and Carrie will reach out to try and get CPE scheduled  °

## 2019-01-18 ENCOUNTER — Other Ambulatory Visit: Payer: Self-pay | Admitting: Family Medicine

## 2019-01-18 DIAGNOSIS — Z1231 Encounter for screening mammogram for malignant neoplasm of breast: Secondary | ICD-10-CM

## 2019-02-22 ENCOUNTER — Ambulatory Visit: Payer: Medicare Other

## 2019-04-15 ENCOUNTER — Other Ambulatory Visit: Payer: Self-pay

## 2019-04-15 ENCOUNTER — Ambulatory Visit
Admission: RE | Admit: 2019-04-15 | Discharge: 2019-04-15 | Disposition: A | Discharge status: Home / Self Care | Payer: Medicare Other | Source: Ambulatory Visit | Attending: Family Medicine | Admitting: Family Medicine

## 2019-04-15 DIAGNOSIS — Z1231 Encounter for screening mammogram for malignant neoplasm of breast: Secondary | ICD-10-CM

## 2019-04-16 ENCOUNTER — Other Ambulatory Visit: Payer: Self-pay | Admitting: Family Medicine

## 2019-04-19 NOTE — Telephone Encounter (Signed)
Please schedule a spring f/u and refill until then 

## 2019-04-19 NOTE — Telephone Encounter (Signed)
Hasn't been seen in a year, and that appt was an acute sick OV and no future appts., please advise

## 2019-04-20 NOTE — Telephone Encounter (Signed)
Med refilled once and Carrie will reach out to pt to get appt scheduled  

## 2019-04-21 ENCOUNTER — Other Ambulatory Visit: Payer: Self-pay

## 2019-04-21 ENCOUNTER — Ambulatory Visit (INDEPENDENT_AMBULATORY_CARE_PROVIDER_SITE_OTHER): Payer: Medicare Other | Admitting: Family Medicine

## 2019-04-21 ENCOUNTER — Encounter: Payer: Self-pay | Admitting: Family Medicine

## 2019-04-21 VITALS — BP 132/70 | HR 84 | Temp 96.8°F | Ht 63.0 in | Wt 245.4 lb

## 2019-04-21 DIAGNOSIS — Z1159 Encounter for screening for other viral diseases: Secondary | ICD-10-CM | POA: Diagnosis not present

## 2019-04-21 DIAGNOSIS — I10 Essential (primary) hypertension: Secondary | ICD-10-CM | POA: Diagnosis not present

## 2019-04-21 DIAGNOSIS — H6983 Other specified disorders of Eustachian tube, bilateral: Secondary | ICD-10-CM | POA: Diagnosis not present

## 2019-04-21 DIAGNOSIS — R7309 Other abnormal glucose: Secondary | ICD-10-CM | POA: Diagnosis not present

## 2019-04-21 DIAGNOSIS — E039 Hypothyroidism, unspecified: Secondary | ICD-10-CM | POA: Diagnosis not present

## 2019-04-21 DIAGNOSIS — E038 Other specified hypothyroidism: Secondary | ICD-10-CM

## 2019-04-21 DIAGNOSIS — R739 Hyperglycemia, unspecified: Secondary | ICD-10-CM

## 2019-04-21 DIAGNOSIS — H6993 Unspecified Eustachian tube disorder, bilateral: Secondary | ICD-10-CM

## 2019-04-21 DIAGNOSIS — H6980 Other specified disorders of Eustachian tube, unspecified ear: Secondary | ICD-10-CM | POA: Insufficient documentation

## 2019-04-21 DIAGNOSIS — Z13228 Encounter for screening for other metabolic disorders: Secondary | ICD-10-CM | POA: Insufficient documentation

## 2019-04-21 MED ORDER — FLUTICASONE PROPIONATE 50 MCG/ACT NA SUSP: 2.0000 | Freq: Every day | NASAL | 5 refills | Status: DC

## 2019-04-21 MED ORDER — HYDROCHLOROTHIAZIDE 25 MG PO TABS: 25.0000 mg | ORAL_TABLET | Freq: Every day | ORAL | 3 refills | Status: DC

## 2019-04-21 NOTE — Assessment & Plan Note (Signed)
No clinical changes (wt loss was intentional)  TSH and FT4 drawn today

## 2019-04-21 NOTE — Assessment & Plan Note (Signed)
With symptoms of ear pressure and muffled hearing  tx with daily steroid ns/flonase  If no imp consider ENT eval  inst to update in 2 wk if no imp  May need eval for hearing aides in the future as well

## 2019-04-21 NOTE — Progress Notes (Signed)
Subjective:    Patient ID: [name], female    DOB: [birth year], 69 y.o.   MRN: [id number]  COVID-19 Labs  This visit occurred during the SARS-CoV-2 public health emergency.  Safety protocols were in place, including screening questions prior to the visit, additional usage of staff PPE, and extensive cleaning of exam room while observing appropriate contact time as indicated for disinfecting solutions.    HPI Pt presents for ear fullness and chronic medical problems   Wt Readings from Last 3 Encounters:  04/21/19 245 lb 7 oz (111.3 kg)  12/16/17 271 lb 8 oz (123.2 kg)  11/28/17 276 lb (125.2 kg)  wt is down significantly  Is in a program for weight loss - taking several supplements (including a "carb blocker"  Started doing it with some family members in april Feels very very good  Exercise-walking regularly  Also very busy at home  43.48 kg/m   Water gets into her ears when she washes her hair  Feel like water is in them  They pop and then she can hear better  Yawning helps   Husband thinks her hearing is worse  No pain   Has had cerumen impaction in the past She uses q tips   She used a steroid ns for 1 week - no imp   bp is stable today  No cp or palpitations or headaches or edema  No side effects to medicines  BP Readings from Last 3 Encounters:  04/21/19 132/70  05/12/18 130/82  12/16/17 126/78     Lab Results  Component Value Date   CREATININE 0.91 12/16/2017   BUN 14 12/16/2017   NA 139 12/16/2017   K 3.6 12/16/2017   CL 97 12/16/2017   CO2 32 12/16/2017   Takes hctz 25 mg daily  Subclinical hypothyroid No symptoms  Lab Results  Component Value Date   TSH 5.03 (H) 11/28/2017    Patient Active Problem List   Diagnosis Date Noted  . Encounter for hepatitis C screening test for low risk patient 04/21/2019  . Elevated random blood glucose level 12/16/2017  . Subclinical hypothyroidism 12/16/2017  . Morbid obesity ([place]) 11/29/2017  .  Essential hypertension 11/28/2017  . Localized deposits of fat 11/30/2014  . Arthritis of knee, right 02/04/2014  . History of anemia 12/06/2013  . History of pulmonary embolism 01/02/2010   Past Medical History:  Diagnosis Date  . Allergic rhinitis   . Family history of diabetes mellitus   . History of pulmonary embolus (PE) 11/2009   post-op LLL PE  . Osteoarthritis    "both knees" (02/04/2014)  . Other specified congenital anomaly of skin   . Peripheral vascular disease ([place])   . Urinary frequency    Past Surgical History:  Procedure Laterality Date  . JOINT REPLACEMENT    . KNEE ARTHROSCOPY Bilateral X 3   "2 on one side; one on the other"  . TONSILLECTOMY    . 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 TOTAL KNEE ARTHROPLASTY Right 02/04/2014  . TOTAL KNEE ARTHROPLASTY Right 02/04/2014   Procedure: RIGHT TOTAL KNEE ARTHROPLASTY;  Surgeon: Augustin Schooling, MD;  Location: [facility];  Service: Orthopedics;  Laterality: Right;  . TUBAL LIGATION  1983  . Uterine Biopsy     RE: fibroid tumors   Social History   Tobacco Use  . Smoking status: Never Smoker  . Smokeless tobacco: Never Used  Substance Use Topics  .  Alcohol use: Yes    Alcohol/week: 0.0 standard drinks    Comment: 02/04/2014 "maybe once/month I'll have a mixed drink or ffruit lavored drink"  . Drug use: No   Family History  Problem Relation Age of Onset  . Cervical cancer Mother   . Diabetes type II Mother   . Hiatal hernia Mother   . Coronary artery disease Father        CABG  . Diabetes Father   . Cancer Father        bladder  . Arrhythmia Father   . Gout Father   . Osteoarthritis Paternal Grandmother   . Cancer Paternal Grandfather   . Diabetes Paternal Grandfather   . Parkinsonism Maternal Grandmother   . Other Maternal Grandfather        "fluid in his lungs"  . Breast cancer Neg Hx    Allergies  Allergen Reactions  . Penicillins Swelling   No current outpatient  medications on file prior to visit.   No current facility-administered medications on file prior to visit.    Review of Systems  Constitutional: Negative for activity change, appetite change, fatigue, fever and unexpected weight change.  HENT: Positive for hearing loss. Negative for congestion, ear discharge, ear pain, rhinorrhea, sinus pressure, sore throat and tinnitus.   Eyes: Negative for pain, redness and visual disturbance.  Respiratory: Negative for cough, shortness of breath and wheezing.   Cardiovascular: Negative for chest pain and palpitations.  Gastrointestinal: Negative for abdominal pain, blood in stool, constipation and diarrhea.  Endocrine: Negative for polydipsia and polyuria.  Genitourinary: Negative for dysuria, frequency and urgency.  Musculoskeletal: Negative for arthralgias, back pain and myalgias.  Skin: Negative for pallor and rash.  Allergic/Immunologic: Negative for environmental allergies.  Neurological: Negative for dizziness, syncope and headaches.  Hematological: Negative for adenopathy. Does not bruise/bleed easily.  Psychiatric/Behavioral: Negative for decreased concentration and dysphoric mood. The patient is not nervous/anxious.        Objective:   Physical Exam Constitutional:      General: She is not in acute distress.    Appearance: Normal appearance. She is well-developed. She is obese. She is not ill-appearing or diaphoretic.  HENT:     Head: Normocephalic and atraumatic.     Right Ear: Ear canal and external ear normal. There is no impacted cerumen.     Left Ear: Ear canal and external ear normal. There is no impacted cerumen.     Ears:     Comments: R TM is slt retracted and dull  L TM is dull  No obv effusion  No erythema     Nose: Nose normal.     Mouth/Throat:     Mouth: Mucous membranes are moist.     Pharynx: Oropharynx is clear.  Eyes:     General: No scleral icterus.       Right eye: No discharge.        Left eye: No  discharge.     Conjunctiva/sclera: Conjunctivae normal.     Pupils: Pupils are equal, round, and reactive to light.  Neck:     Thyroid: No thyromegaly.     Vascular: No carotid bruit or JVD.  Cardiovascular:     Rate and Rhythm: Normal rate and regular rhythm.     Heart sounds: Normal heart sounds. No gallop.   Pulmonary:     Effort: Pulmonary effort is normal. No respiratory distress.     Breath sounds: Normal breath sounds. No wheezing or rales.  Abdominal:     General: Bowel sounds are normal. There is no distension or abdominal bruit.     Palpations: Abdomen is soft. There is no mass.     Tenderness: There is no abdominal tenderness.  Musculoskeletal:     Cervical back: Normal range of motion and neck supple.  Lymphadenopathy:     Cervical: No cervical adenopathy.  Skin:    General: Skin is warm and dry.     Findings: No erythema or rash.  Neurological:     Mental Status: She is alert.     Cranial Nerves: No cranial nerve deficit.     Coordination: Coordination normal.     Deep Tendon Reflexes: Reflexes are normal and symmetric.  Psychiatric:        Mood and Affect: Mood normal.           Assessment & Plan:   Problem List Items Addressed This Visit      Cardiovascular and Mediastinum   Essential hypertension - Primary    bp in fair control at this time  BP Readings from Last 1 Encounters:  04/21/19 132/70   No changes needed Most recent labs reviewed  Disc lifstyle change with low sodium diet and exercise  Labs today  Commended with weight loss so far       Relevant Medications   hydrochlorothiazide (HYDRODIURIL) 25 MG tablet   Other Relevant Orders   CBC w/Diff   Comprehensive metabolic panel   Lipid panel   TSH     Endocrine   Subclinical hypothyroidism    No clinical changes (wt loss was intentional)  TSH and FT4 drawn today      Relevant Orders   TSH   T4, Free     Nervous and Auditory   Eustachian tube dysfunction    With symptoms of  ear pressure and muffled hearing  tx with daily steroid ns/flonase  If no imp consider ENT eval  inst to update in 2 wk if no imp  May need eval for hearing aides in the future as well        Other   Morbid obesity (HCC)    Discussed how this problem influences overall health and the risks it imposes  Reviewed plan for weight loss with lower calorie diet (via better food choices and also portion control or program like weight watchers) and exercise building up to or more than 30 minutes 5 days per week including some aerobic activity   Commended wt loss so far Enc more exercise  inst to use caution re: supplements from her wt loss program        Elevated random blood glucose level    A1C today  Expect good results with wt loss and better diet       Relevant Orders   Hemoglobin A1c   Encounter for hepatitis C screening test for low risk patient    Low risk screening Hep C drawn with labs       Relevant Orders   Hepatitis C antibody

## 2019-04-21 NOTE — Assessment & Plan Note (Signed)
A1C today  Expect good results with wt loss and better diet

## 2019-04-21 NOTE — Assessment & Plan Note (Signed)
Discussed how this problem influences overall health and the risks it imposes  Reviewed plan for weight loss with lower calorie diet (via better food choices and also portion control or program like weight watchers) and exercise building up to or more than 30 minutes 5 days per week including some aerobic activity   Commended wt loss so far Enc more exercise  inst to use caution re: supplements from her wt loss program

## 2019-04-21 NOTE — Patient Instructions (Addendum)
Aim for at least 30 minutes of aerobic exercise daily   Keep up the good work with healthy diet and exercise  [place] job with weight loss so far  Use great caution with the supplements   Use generic flonase nasal spray once daily to help eustachian tube dysfunction   Labs today  Blood pressure is ok

## 2019-04-21 NOTE — Assessment & Plan Note (Signed)
bp in fair control at this time  BP Readings from Last 1 Encounters:  04/21/19 132/70   No changes needed Most recent labs reviewed  Disc lifstyle change with low sodium diet and exercise  Labs today  Commended with weight loss so far

## 2019-04-21 NOTE — Assessment & Plan Note (Signed)
Low risk screening Hep C drawn with labs

## 2019-04-22 LAB — COMPREHENSIVE METABOLIC PANEL
ALT: 9 U/L (ref 0–35)
AST: 13 U/L (ref 0–37)
Albumin: 4.2 g/dL (ref 3.5–5.2)
Alkaline Phosphatase: 104 U/L (ref 39–117)
BUN: 17 mg/dL (ref 6–23)
CO2: 31 mEq/L (ref 19–32)
Calcium: 10.1 mg/dL (ref 8.4–10.5)
Chloride: 98 mEq/L (ref 96–112)
Creatinine, Ser: 0.82 mg/dL (ref 0.40–1.20)
GFR: 68.8 mL/min (ref >60.00)
Glucose, Bld: 90 mg/dL (ref 70–99)
Potassium: 4.4 mEq/L (ref 3.5–5.1)
Sodium: 138 mEq/L (ref 135–145)
Total Bilirubin: 0.8 mg/dL (ref 0.2–1.2)
Total Protein: 7.6 g/dL (ref 6.0–8.3)

## 2019-04-22 LAB — CBC WITH DIFFERENTIAL/PLATELET
Basophils Absolute: 0.2 10*3/uL — ABNORMAL HIGH (ref 0.0–0.1)
Basophils Relative: 1.3 % (ref 0.0–3.0)
Eosinophils Absolute: 0.5 10*3/uL (ref 0.0–0.7)
Eosinophils Relative: 3.6 % (ref 0.0–5.0)
HCT: 41.3 % (ref 36.0–46.0)
Hemoglobin: 13.7 g/dL (ref 12.0–15.0)
Lymphocytes Relative: 20.9 % (ref 12.0–46.0)
Lymphs Abs: 2.7 10*3/uL (ref 0.7–4.0)
MCHC: 33.1 g/dL (ref 30.0–36.0)
MCV: 88 fl (ref 78.0–100.0)
Monocytes Absolute: 1.1 10*3/uL — ABNORMAL HIGH (ref 0.1–1.0)
Monocytes Relative: 8.8 % (ref 3.0–12.0)
Neutro Abs: 8.3 10*3/uL — ABNORMAL HIGH (ref 1.4–7.7)
Neutrophils Relative %: 65.4 % (ref 43.0–77.0)
Platelets: 381 10*3/uL (ref 150.0–400.0)
RBC: 4.69 Mil/uL (ref 3.87–5.11)
RDW: 13.9 % (ref 11.5–15.5)
WBC: 12.7 10*3/uL — ABNORMAL HIGH (ref 4.0–10.5)

## 2019-04-22 LAB — TSH: TSH: 3.42 u[IU]/mL (ref 0.35–4.50)

## 2019-04-22 LAB — LIPID PANEL
Cholesterol: 182 mg/dL (ref 0–200)
HDL: 53.7 mg/dL (ref >39.00)
LDL Cholesterol: 112 mg/dL — ABNORMAL HIGH (ref 0–99)
NonHDL: 128.79
Total CHOL/HDL Ratio: 3
Triglycerides: 85 mg/dL (ref 0.0–149.0)
VLDL: 17 mg/dL (ref 0.0–40.0)

## 2019-04-22 LAB — HEPATITIS C ANTIBODY
Hepatitis C Ab: NONREACTIVE
SIGNAL TO CUT-OFF: 0.03 (ref <1.00)

## 2019-04-22 LAB — T4, FREE: Free T4: 1.16 ng/dL (ref 0.60–1.60)

## 2019-04-22 LAB — HEMOGLOBIN A1C: Hgb A1c MFr Bld: 5.9 % (ref 4.6–6.5)

## 2019-05-24 ENCOUNTER — Encounter: Payer: Self-pay | Admitting: Family Medicine

## 2019-05-24 ENCOUNTER — Other Ambulatory Visit: Payer: Self-pay

## 2019-05-24 ENCOUNTER — Ambulatory Visit (INDEPENDENT_AMBULATORY_CARE_PROVIDER_SITE_OTHER): Payer: Medicare Other | Admitting: Family Medicine

## 2019-05-24 DIAGNOSIS — B029 Zoster without complications: Secondary | ICD-10-CM

## 2019-05-24 NOTE — Assessment & Plan Note (Signed)
Rash over R T12 and L1 dermatome consistent with zoster  Pain is minimal  Out of date range for antiviral  inst to keep clean with soap and water and avoid friction with clothing Avoid immunocompromised people  nsaid or acetaminophen prn with food for pain If more painful will call for px  Update if not starting to improve in a week or if worsening   I do recommend immunization in the future

## 2019-05-24 NOTE — Patient Instructions (Addendum)
You have shingles  Keep area clean and dry - gentle soap and water   Avoid friction when you can  Cool compresses are fine   Aleve is ok for discomfort -take with food  Tylenol is another option   If discomfort worsens- let us know   The rash will gradually improve/dry up  It may scar a bit   Keep [place] posted

## 2019-05-24 NOTE — Progress Notes (Signed)
Subjective:    Patient ID: [name], female    DOB: Jan 12, 1951, 69 y.o.   MRN: [id number]  This visit occurred during the SARS-CoV-2 public health emergency.  Safety protocols were in place, including screening questions prior to the visit, additional usage of staff PPE, and extensive cleaning of exam room while observing appropriate contact time as indicated for disinfecting solutions.    HPI  Here for a rash Last week- around Tuesday she felt bloated/constipated with some gas   Then broke out in a rash on R lower abdomen /hip on Wednesday Blistery  Put neosporin on it   Some burning sensation -makes it hard to sleep  A cool compress helps   Not much appetite however  A little nausea on and off   Thought it looked like shingles  Rash did worsen- and it extended to the R side of back (does not extend to the midline)   She has taken 2 aleve   Patient Active Problem List   Diagnosis Date Noted  . Shingles 05/24/2019  . Encounter for hepatitis C screening test for low risk patient 04/21/2019  . Eustachian tube dysfunction 04/21/2019  . Elevated random blood glucose level 12/16/2017  . Subclinical hypothyroidism 12/16/2017  . Morbid obesity (HCC) 11/29/2017  . Essential hypertension 11/28/2017  . Localized deposits of fat 11/30/2014  . Arthritis of knee, right 02/04/2014  . History of anemia 12/06/2013  . History of pulmonary embolism 01/02/2010   Past Medical History:  Diagnosis Date  . Allergic rhinitis   . Family history of diabetes mellitus   . History of pulmonary embolus (PE) 11/2009   post-op LLL PE  . Osteoarthritis    "both knees" (02/04/2014)  . Other specified congenital anomaly of skin   . Peripheral vascular disease (HCC)   . Urinary frequency    Past Surgical History:  Procedure Laterality Date  . JOINT REPLACEMENT    . KNEE ARTHROSCOPY Bilateral X 3   "2 on one side; one on the other"  . TONSILLECTOMY    . 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 TOTAL KNEE ARTHROPLASTY Right 02/04/2014  . TOTAL KNEE ARTHROPLASTY Right 02/04/2014   Procedure: RIGHT TOTAL KNEE ARTHROPLASTY;  Surgeon: Verlee Rossetti, MD;  Location: [facility];  Service: Orthopedics;  Laterality: Right;  . TUBAL LIGATION  1983  . Uterine Biopsy     RE: fibroid tumors   Social History   Tobacco Use  . Smoking status: Never Smoker  . Smokeless tobacco: Never Used  Substance Use Topics  . Alcohol use: Yes    Alcohol/week: 0.0 standard drinks    Comment: 02/04/2014 "maybe once/month I'll have a mixed drink or ffruit lavored drink"  . Drug use: No   Family History  Problem Relation Age of Onset  . Cervical cancer Mother   . Diabetes type II Mother   . Hiatal hernia Mother   . Coronary artery disease Father        CABG  . Diabetes Father   . Cancer Father        bladder  . Arrhythmia Father   . Gout Father   . Osteoarthritis Paternal Grandmother   . Cancer Paternal Grandfather   . Diabetes Paternal Grandfather   . Parkinsonism Maternal Grandmother   . Other Maternal Grandfather        "fluid in his lungs"  . Breast cancer Neg Hx    Allergies  Allergen Reactions  . Penicillins Swelling   Current Outpatient Medications on File Prior to Visit  Medication Sig Dispense Refill  . fluticasone (FLONASE) 50 MCG/ACT nasal spray Place 2 sprays into both nostrils daily. 16 g 5  . hydrochlorothiazide (HYDRODIURIL) 25 MG tablet Take 1 tablet (25 mg total) by mouth daily. 90 tablet 3   No current facility-administered medications on file prior to visit.    Review of Systems  Constitutional: Negative for activity change, appetite change, fatigue, fever and unexpected weight change.  HENT: Negative for congestion, ear pain, rhinorrhea, sinus pressure and sore throat.   Eyes: Negative for pain, redness and visual disturbance.  Respiratory: Negative for cough, shortness of breath and wheezing.   Cardiovascular: Negative for chest pain and  palpitations.  Gastrointestinal: Negative for abdominal pain, blood in stool, constipation and diarrhea.  Endocrine: Negative for polydipsia and polyuria.  Genitourinary: Negative for dysuria, frequency and urgency.  Musculoskeletal: Negative for arthralgias, back pain and myalgias.  Skin: Positive for rash. Negative for pallor.  Allergic/Immunologic: Negative for environmental allergies.  Neurological: Negative for dizziness, syncope and headaches.  Hematological: Negative for adenopathy. Does not bruise/bleed easily.  Psychiatric/Behavioral: Negative for decreased concentration and dysphoric mood. The patient is not nervous/anxious.        Objective:   Physical Exam Constitutional:      General: She is not in acute distress.    Appearance: Normal appearance. She is obese. She is not ill-appearing or diaphoretic.  HENT:     Head: Atraumatic.  Eyes:     General:        Right eye: No discharge.        Left eye: No discharge.     Conjunctiva/sclera: Conjunctivae normal.     Pupils: Pupils are equal, round, and reactive to light.  Cardiovascular:     Rate and Rhythm: Normal rate and regular rhythm.     Pulses: Normal pulses.  Pulmonary:     Effort: Pulmonary effort is normal. No respiratory distress.     Breath sounds: Normal breath sounds. No wheezing or rales.  Abdominal:     General: Abdomen is flat. Bowel sounds are normal. There is no distension.     Palpations: Abdomen is soft.     Tenderness: There is no abdominal tenderness.  Musculoskeletal:     Cervical back: Normal range of motion and neck supple.     Right lower leg: No edema.     Left lower leg: No edema.  Lymphadenopathy:     Cervical: No cervical adenopathy.  Skin:    General: Skin is warm and dry.     Findings: Rash present.     Comments: Vesicular rash/ partially drying with erythema over R T12 and L1 dermatomal distribution  Consistent with herpes zoster  Mildly tender  No swelling   Neurological:      Mental Status: She is alert.     Sensory: No sensory deficit.     Coordination: Coordination normal.  Psychiatric:        Mood and Affect: Mood normal.           Assessment & Plan:   Problem List Items Addressed This Visit      Other   Shingles    Rash over R T12 and L1 dermatome consistent with zoster  Pain is minimal  Out of date range for antiviral  inst to keep clean with soap and water and avoid friction with clothing Avoid immunocompromised people  nsaid or  acetaminophen prn with food for pain If more painful will call for px  Update if not starting to improve in a week or if worsening   I do recommend immunization in the future

## 2019-08-10 ENCOUNTER — Ambulatory Visit: Payer: Medicare Other | Admitting: Family Medicine

## 2019-08-17 ENCOUNTER — Ambulatory Visit (INDEPENDENT_AMBULATORY_CARE_PROVIDER_SITE_OTHER): Payer: Medicare Other

## 2019-08-17 DIAGNOSIS — Z0000 Encounter for general adult medical examination without abnormal findings: Secondary | ICD-10-CM | POA: Diagnosis not present

## 2019-08-17 NOTE — Progress Notes (Signed)
PCP notes:  Health Maintenance: Declined all vaccines Colonoscopy- declined dexa- declined   Abnormal Screenings: none   Patient concerns: none   Nurse concerns: none   Next PCP appt.: none

## 2019-08-17 NOTE — Progress Notes (Signed)
Subjective:   [name] is a 69 y.o. female who presents for an Initial Medicare Annual Wellness Visit.  Review of Systems: N/A      This visit is being conducted through telemedicine via telephone at the nurse health advisor's home address due to the COVID-19 pandemic. This patient has given me verbal consent via doximity to conduct this visit, patient states they are participating from their home address. Patient and myself are on the telephone call. There is no referral for this visit. Some vital signs may be absent or patient reported.    Patient identification: identified by name, DOB, and current address    Cardiac Risk Factors include: advanced age (>21men, >91 women);hypertension     Objective:    Today's Vitals   There is no height or weight on file to calculate BMI.  Advanced Directives 08/17/2019 11/27/2017 02/04/2014 01/26/2014  Does Patient Have a Medical Advance Directive? Yes No No No  Type of Paramedic of [place];Living will - - -  Copy of Josephine in Chart? No - copy requested - - -  Would patient like information on creating a medical advance directive? - - No - patient declined information No - patient declined information    Current Medications (verified) Outpatient Encounter Medications as of 08/17/2019  Medication Sig  . fluticasone (FLONASE) 50 MCG/ACT nasal spray Place 2 sprays into both nostrils daily.  . hydrochlorothiazide (HYDRODIURIL) 25 MG tablet Take 1 tablet (25 mg total) by mouth daily.   No facility-administered encounter medications on file as of 08/17/2019.    Allergies (verified) Penicillins   History: Past Medical History:  Diagnosis Date  . Allergic rhinitis   . Family history of diabetes mellitus   . History of pulmonary embolus (PE) 11/2009   post-op LLL PE  . Osteoarthritis    "both knees" (02/04/2014)  . Other specified congenital anomaly of skin   . Peripheral vascular disease  ([place])   . Urinary frequency    Past Surgical History:  Procedure Laterality Date  . JOINT REPLACEMENT    . KNEE ARTHROSCOPY Bilateral X 3   "2 on one side; one on the other"  . TONSILLECTOMY    . 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 TOTAL KNEE ARTHROPLASTY Right 02/04/2014  . TOTAL KNEE ARTHROPLASTY Right 02/04/2014   Procedure: RIGHT TOTAL KNEE ARTHROPLASTY;  Surgeon: Augustin Schooling, MD;  Location: [facility];  Service: Orthopedics;  Laterality: Right;  . TUBAL LIGATION  1983  . Uterine Biopsy     RE: fibroid tumors   Family History  Problem Relation Age of Onset  . Cervical cancer Mother   . Diabetes type II Mother   . Hiatal hernia Mother   . Coronary artery disease Father        CABG  . Diabetes Father   . Cancer Father        bladder  . Arrhythmia Father   . Gout Father   . Osteoarthritis Paternal Grandmother   . Cancer Paternal Grandfather   . Diabetes Paternal Grandfather   . Parkinsonism Maternal Grandmother   . Other Maternal Grandfather        "fluid in his lungs"  . Breast cancer Neg Hx    Social History   Socioeconomic History  . Marital status: Married    Spouse name: Not on file  . Number of children: Not on file  . Years of education:  Not on file  . Highest education level: Not on file  Occupational History  . Not on file  Tobacco Use  . Smoking status: Never Smoker  . Smokeless tobacco: Never Used  Substance and Sexual Activity  . Alcohol use: Yes    Alcohol/week: 0.0 standard drinks    Comment: 02/04/2014 "maybe once/month I'll have a mixed drink or ffruit lavored drink"  . Drug use: No  . Sexual activity: Yes  Other Topics Concern  . Not on file  Social History Narrative  . Not on file   Social Determinants of Health   Financial Resource Strain: Low Risk   . Difficulty of Paying Living Expenses: Not hard at all  Food Insecurity: No Food Insecurity  . Worried About Programme researcher, broadcasting/film/video in the Last Year: Never true    . Ran Out of Food in the Last Year: Never true  Transportation Needs: No Transportation Needs  . Lack of Transportation (Medical): No  . Lack of Transportation (Non-Medical): No  Physical Activity: Sufficiently Active  . Days of Exercise per Week: 7 days  . Minutes of Exercise per Session: 60 min  Stress: No Stress Concern Present  . Feeling of Stress : Not at all  Social Connections:   . Frequency of Communication with Friends and Family:   . Frequency of Social Gatherings with Friends and Family:   . Attends Religious Services:   . Active Member of Clubs or Organizations:   . Attends Banker Meetings:   Marland Kitchen Marital Status:     Tobacco Counseling Counseling given: Not Answered   Clinical Intake:  Pre-visit preparation completed: Yes  Pain : No/denies pain     Nutritional Risks: None Diabetes: No  How often do you need to have someone help you when you read instructions, pamphlets, or other written materials from your doctor or pharmacy?: 1 - Never What is the last grade level you completed in school?: 12th  Interpreter Needed?: No  Information entered by :: CJohnson, LPN   Activities of Daily Living In your present state of health, do you have any difficulty performing the following activities: 08/17/2019  Hearing? N  Vision? N  Difficulty concentrating or making decisions? N  Walking or climbing stairs? N  Dressing or bathing? N  Doing errands, shopping? N  Preparing Food and eating ? N  Using the Toilet? N  In the past six months, have you accidently leaked urine? N  Do you have problems with loss of bowel control? N  Managing your Medications? N  Managing your Finances? N  Housekeeping or managing your Housekeeping? N  Some recent data might be hidden     Immunizations and Health Maintenance  There is no immunization history on file for this patient. Health Maintenance Due  Topic Date Due  . COVID-19 Vaccine (1) Never done    Patient  Care Team: Tower, Audrie Gallus, MD as PCP - General  Indicate any recent Medical Services you may have received from other than [facility] providers in the past year (date may be approximate).     Assessment:   This is a routine wellness examination for [name].  Hearing/Vision screen  Hearing Screening   125Hz  250Hz  500Hz  1000Hz  2000Hz  3000Hz  4000Hz  6000Hz  8000Hz   Right ear:           Left ear:           Vision Screening Comments: Patient gets annual eye exams   Dietary issues and exercise activities  discussed: Current Exercise Habits: Home exercise routine, Type of exercise: walking, Time (Minutes): 60, Frequency (Times/Week): 7, Weekly Exercise (Minutes/Week): 420, Intensity: Moderate, Exercise limited by: None identified  Goals    . Patient Stated     08/17/2019, I will continue to walk on a daily a basis for about 1 hour.      Depression Screen PHQ 2/9 Scores 08/17/2019 11/28/2017  PHQ - 2 Score 0 0  PHQ- 9 Score 0 -    Fall Risk Fall Risk  08/17/2019 11/28/2017 11/25/2017 12/19/2015  Falls in the past year? 0 No No No  Comment - - - Emmi Telephone Survey: data to providers prior to load  Number falls in past yr: 0 - - -  Injury with Fall? 0 - - -  Risk for fall due to : No Fall Risks - - -  Follow up Falls evaluation completed;Falls prevention discussed - - -    Is the patient's home free of loose throw rugs in walkways, pet beds, electrical cords, etc?   yes      Grab bars in the bathroom? no      Handrails on the stairs?   yes      Adequate lighting?   yes  Timed Get Up and Go Performed: N/A  Cognitive Function: MMSE - Mini Mental State Exam 08/17/2019  Orientation to time 5  Orientation to Place 5  Registration 3  Attention/ Calculation 5  Recall 3  Language- repeat 1       Mini Cog  Mini-Cog screen was completed. Maximum score is 22. A value of 0 denotes this part of the MMSE was not completed or the patient failed this part of the Mini-Cog screening.  Screening  Tests Health Maintenance  Topic Date Due  . COVID-19 Vaccine (1) Never done  . DEXA SCAN  04/20/2020 (Originally 09/14/2013)  . COLONOSCOPY  04/20/2020 (Originally 09/15/1998)  . TETANUS/TDAP  04/20/2020 (Originally 09/15/1967)  . PNA vac Low Risk Adult (1 of 2 - PCV13) 04/20/2029 (Originally 09/14/2013)  . INFLUENZA VACCINE  11/14/2019  . MAMMOGRAM  04/14/2020  . Hepatitis C Screening  Completed    Qualifies for Shingles Vaccine: Yes  Cancer Screenings: Lung: Low Dose CT Chest recommended if Age 41-80 years, 30 pack-year currently smoking OR have quit w/in 15 years. Patient does not qualify.  Breast: Up to date on Mammogram: Yes, completed 04/15/2019   Up to date of Bone Density/Dexa: No, declined Colorectal: declined  Additional Screenings:  Hepatitis C Screening: 05/01/2019    Plan:   Patient will continue to walk on a daily a basis for about 1 hour.  I have personally reviewed and noted the following in the patient's chart:   . Medical and social history . Use of alcohol, tobacco or illicit drugs  . Current medications and supplements . Functional ability and status . Nutritional status . Physical activity . Advanced directives . List of other physicians . Hospitalizations, surgeries, and ER visits in previous 12 months . Vitals . Screenings to include cognitive, depression, and falls . Referrals and appointments  In addition, I have reviewed and discussed with patient certain preventive protocols, quality metrics, and best practice recommendations. A written personalized care plan for preventive services as well as general preventive health recommendations were provided to patient.     Janalyn Shy, LPN   11/19/7670

## 2019-08-17 NOTE — Patient Instructions (Signed)
[name] , Thank you for taking time to come for your Medicare Wellness Visit. I appreciate your ongoing commitment to your health goals. Please review the following plan we discussed and let me know if I can assist you in the future.   Screening recommendations/referrals: Colonoscopy: declined Mammogram: Up to date, completed 04/15/2019 Bone Density: declined Recommended yearly ophthalmology/optometry visit for glaucoma screening and checkup Recommended yearly dental visit for hygiene and checkup  Vaccinations: Influenza vaccine: declined Pneumococcal vaccine: declined Tdap vaccine: declined Shingles vaccine: declined    Advanced directives: Please bring a copy of your POA (Power of Attorney) and/or Living Will to your next appointment.   Conditions/risks identified: hypertension  Next appointment: none   Preventive Care 65 Years and Older, Female Preventive care refers to lifestyle choices and visits with your health care provider that can promote health and wellness. What does preventive care include?  A yearly physical exam. This is also called an annual well check.  Dental exams once or twice a year.  Routine eye exams. Ask your health care provider how often you should have your eyes checked.  Personal lifestyle choices, including:  Daily care of your teeth and gums.  Regular physical activity.  Eating a healthy diet.  Avoiding tobacco and drug use.  Limiting alcohol use.  Practicing safe sex.  Taking low-dose aspirin every day.  Taking vitamin and mineral supplements as recommended by your health care provider. What happens during an annual well check? The services and screenings done by your health care provider during your annual well check will depend on your age, overall health, lifestyle risk factors, and family history of disease. Counseling  Your health care provider may ask you questions about your:  Alcohol use.  Tobacco use.  Drug  use.  Emotional well-being.  Home and relationship well-being.  Sexual activity.  Eating habits.  History of falls.  Memory and ability to understand (cognition).  Work and work Astronomer.  Reproductive health. Screening  You may have the following tests or measurements:  Height, weight, and BMI.  Blood pressure.  Lipid and cholesterol levels. These may be checked every 5 years, or more frequently if you are over 42 years old.  Skin check.  Lung cancer screening. You may have this screening every year starting at age 84 if you have a 30-pack-year history of smoking and currently smoke or have quit within the past 15 years.  Fecal occult blood test (FOBT) of the stool. You may have this test every year starting at age 70.  Flexible sigmoidoscopy or colonoscopy. You may have a sigmoidoscopy every 5 years or a colonoscopy every 10 years starting at age 71.  Hepatitis C blood test.  Hepatitis B blood test.  Sexually transmitted disease (STD) testing.  Diabetes screening. This is done by checking your blood sugar (glucose) after you have not eaten for a while (fasting). You may have this done every 1-3 years.  Bone density scan. This is done to screen for osteoporosis. You may have this done starting at age 47.  Mammogram. This may be done every 1-2 years. Talk to your health care provider about how often you should have regular mammograms. Talk with your health care provider about your test results, treatment options, and if necessary, the need for more tests. Vaccines  Your health care provider may recommend certain vaccines, such as:  Influenza vaccine. This is recommended every year.  Tetanus, diphtheria, and acellular pertussis (Tdap, Td) vaccine. You may need a Td  booster every 10 years.  Zoster vaccine. You may need this after age 4.  Pneumococcal 13-valent conjugate (PCV13) vaccine. One dose is recommended after age 66.  Pneumococcal polysaccharide  (PPSV23) vaccine. One dose is recommended after age 17. Talk to your health care provider about which screenings and vaccines you need and how often you need them. This information is not intended to replace advice given to you by your health care provider. Make sure you discuss any questions you have with your health care provider. Document Released: 04/28/2015 Document Revised: 12/20/2015 Document Reviewed: 01/31/2015 Elsevier Interactive Patient Education  2017 [place] Prevention in the Home Falls can cause injuries. They can happen to people of all ages. There are many things you can do to make your home safe and to help prevent falls. What can I do on the outside of my home?  Regularly fix the edges of walkways and driveways and fix any cracks.  Remove anything that might make you trip as you walk through a door, such as a raised step or threshold.  Trim any bushes or trees on the path to your home.  Use bright outdoor lighting.  Clear any walking paths of anything that might make someone trip, such as rocks or tools.  Regularly check to see if handrails are loose or broken. Make sure that both sides of any steps have handrails.  Any raised decks and porches should have guardrails on the edges.  Have any leaves, snow, or ice cleared regularly.  Use sand or salt on walking paths during winter.  Clean up any spills in your garage right away. This includes oil or grease spills. What can I do in the bathroom?  Use night lights.  Install grab bars by the toilet and in the tub and shower. Do not use towel bars as grab bars.  Use non-skid mats or decals in the tub or shower.  If you need to sit down in the shower, use a plastic, non-slip stool.  Keep the floor dry. Clean up any water that spills on the floor as soon as it happens.  Remove soap buildup in the tub or shower regularly.  Attach bath mats securely with double-sided non-slip rug tape.  Do not have  throw rugs and other things on the floor that can make you trip. What can I do in the bedroom?  Use night lights.  Make sure that you have a light by your bed that is easy to reach.  Do not use any sheets or blankets that are too big for your bed. They should not hang down onto the floor.  Have a firm chair that has side arms. You can use this for support while you get dressed.  Do not have throw rugs and other things on the floor that can make you trip. What can I do in the kitchen?  Clean up any spills right away.  Avoid walking on wet floors.  Keep items that you use a lot in easy-to-reach places.  If you need to reach something above you, use a strong step stool that has a grab bar.  Keep electrical cords out of the way.  Do not use floor polish or wax that makes floors slippery. If you must use wax, use non-skid floor wax.  Do not have throw rugs and other things on the floor that can make you trip. What can I do with my stairs?  Do not leave any items on the stairs.  Make sure that there are handrails on both sides of the stairs and use them. Fix handrails that are broken or loose. Make sure that handrails are as long as the stairways.  Check any carpeting to make sure that it is firmly attached to the stairs. Fix any carpet that is loose or worn.  Avoid having throw rugs at the top or bottom of the stairs. If you do have throw rugs, attach them to the floor with carpet tape.  Make sure that you have a light switch at the top of the stairs and the bottom of the stairs. If you do not have them, ask someone to add them for you. What else can I do to help prevent falls?  Wear shoes that:  Do not have high heels.  Have rubber bottoms.  Are comfortable and fit you well.  Are closed at the toe. Do not wear sandals.  If you use a stepladder:  Make sure that it is fully opened. Do not climb a closed stepladder.  Make sure that both sides of the stepladder are  locked into place.  Ask someone to hold it for you, if possible.  Clearly mark and make sure that you can see:  Any grab bars or handrails.  First and last steps.  Where the edge of each step is.  Use tools that help you move around (mobility aids) if they are needed. These include:  Canes.  Walkers.  Scooters.  Crutches.  Turn on the lights when you go into a dark area. Replace any light bulbs as soon as they burn out.  Set up your furniture so you have a clear path. Avoid moving your furniture around.  If any of your floors are uneven, fix them.  If there are any pets around you, be aware of where they are.  Review your medicines with your doctor. Some medicines can make you feel dizzy. This can increase your chance of falling. Ask your doctor what other things that you can do to help prevent falls. This information is not intended to replace advice given to you by your health care provider. Make sure you discuss any questions you have with your health care provider. Document Released: 01/26/2009 Document Revised: 09/07/2015 Document Reviewed: 05/06/2014 Elsevier Interactive Patient Education  2017 [employer].

## 2019-10-27 ENCOUNTER — Telehealth: Payer: Self-pay | Admitting: Family Medicine

## 2019-10-27 DIAGNOSIS — H9193 Unspecified hearing loss, bilateral: Secondary | ICD-10-CM

## 2019-10-27 DIAGNOSIS — H9190 Unspecified hearing loss, unspecified ear: Secondary | ICD-10-CM | POA: Insufficient documentation

## 2019-10-27 NOTE — Telephone Encounter (Signed)
Referral done I sent it to the [facility]  Let her know she will get a call

## 2019-10-27 NOTE — Telephone Encounter (Signed)
Patient called in stating she needs a referral sent to [facility] for a hearing test. Its a requirement for new patients and she needs it for insurance to file it. Fax number is [phone].

## 2019-10-28 NOTE — Telephone Encounter (Signed)
Referral faxed to [facility]

## 2019-11-16 ENCOUNTER — Encounter: Payer: Self-pay | Admitting: Family Medicine

## 2019-11-16 DIAGNOSIS — H9313 Tinnitus, bilateral: Secondary | ICD-10-CM | POA: Diagnosis not present

## 2019-11-16 DIAGNOSIS — H903 Sensorineural hearing loss, bilateral: Secondary | ICD-10-CM | POA: Diagnosis not present

## 2020-03-06 ENCOUNTER — Other Ambulatory Visit: Payer: Self-pay | Admitting: Family Medicine

## 2020-03-06 DIAGNOSIS — Z1231 Encounter for screening mammogram for malignant neoplasm of breast: Secondary | ICD-10-CM

## 2020-04-21 ENCOUNTER — Ambulatory Visit: Payer: Medicare Other

## 2020-07-23 ENCOUNTER — Other Ambulatory Visit: Payer: Self-pay | Admitting: Family Medicine

## 2020-07-25 NOTE — Telephone Encounter (Signed)
Pt hasn't been seen in over a year and no future appts., please advise  

## 2020-07-25 NOTE — Telephone Encounter (Signed)
Please schedule PE or f/u and refill until then 

## 2020-07-26 ENCOUNTER — Telehealth: Payer: Self-pay | Admitting: *Deleted

## 2020-07-26 NOTE — Telephone Encounter (Signed)
-----   Message from Conni Elliot sent at 07/26/2020  1:24 PM EDT ----- I called patient.  She had no idea she was on a fluid pill. She's asking for you to call her back and explain why she's taking the medication.  I tried to schedule her appointment, but she said she has several appointments in April and May and she'll have to call back to schedule.

## 2020-07-26 NOTE — Telephone Encounter (Addendum)
-----   Message ----- From: Shon Millet, CMA Sent: 07/26/2020  12:36 PM EDT To: Conni Elliot  I refilled pt's fluid pill once and per Dr. Milinda Antis:  Please schedule PE or f/u and refill until then

## 2020-07-26 NOTE — Telephone Encounter (Signed)
See prev note from pt when [name] tried to schedule her a f/u after I refilled her HCTZ once

## 2020-07-26 NOTE — Telephone Encounter (Signed)
Med refilled once and Carrie will reach out to pt to try and get appt scheduled  

## 2020-07-26 NOTE — Telephone Encounter (Signed)
Explained to pt why she's on HCTZ and f/u appt scheduled

## 2020-07-26 NOTE — Telephone Encounter (Signed)
She is taking it for blood pressure. Thanks, I will see her then

## 2020-08-23 ENCOUNTER — Ambulatory Visit: Payer: Medicare Other

## 2020-08-27 ENCOUNTER — Other Ambulatory Visit: Payer: Self-pay | Admitting: Family Medicine

## 2020-09-07 ENCOUNTER — Other Ambulatory Visit: Payer: Self-pay

## 2020-09-07 ENCOUNTER — Ambulatory Visit
Admission: RE | Admit: 2020-09-07 | Discharge: 2020-09-07 | Disposition: A | Discharge status: Home / Self Care | Payer: Medicare Other | Source: Ambulatory Visit | Attending: Family Medicine | Admitting: Family Medicine

## 2020-09-07 DIAGNOSIS — Z1231 Encounter for screening mammogram for malignant neoplasm of breast: Secondary | ICD-10-CM | POA: Diagnosis not present

## 2020-09-26 ENCOUNTER — Ambulatory Visit (INDEPENDENT_AMBULATORY_CARE_PROVIDER_SITE_OTHER): Payer: Medicare Other | Admitting: Family Medicine

## 2020-09-26 ENCOUNTER — Other Ambulatory Visit: Payer: Self-pay

## 2020-09-26 ENCOUNTER — Encounter: Payer: Self-pay | Admitting: Family Medicine

## 2020-09-26 VITALS — BP 128/74 | HR 71 | Temp 97.4°F | Ht 63.0 in | Wt 223.0 lb

## 2020-09-26 DIAGNOSIS — I10 Essential (primary) hypertension: Secondary | ICD-10-CM | POA: Diagnosis not present

## 2020-09-26 DIAGNOSIS — Z6839 Body mass index (BMI) 39.0-39.9, adult: Secondary | ICD-10-CM | POA: Diagnosis not present

## 2020-09-26 DIAGNOSIS — E038 Other specified hypothyroidism: Secondary | ICD-10-CM

## 2020-09-26 DIAGNOSIS — R7309 Other abnormal glucose: Secondary | ICD-10-CM

## 2020-09-26 MED ORDER — HYDROCHLOROTHIAZIDE 25 MG PO TABS: 1.0000 | ORAL_TABLET | Freq: Every day | ORAL | 3 refills | Status: DC

## 2020-09-26 NOTE — Patient Instructions (Signed)
Keep working on [employer] and exercise and weight loss   Labs today  No change in medicine    If you ever want the shingles vaccine If you are interested in the new shingles vaccine (Shingrix) - call your local pharmacy to check on coverage and availability  If affordable, get on a wait list at your pharmacy to get the vaccine.

## 2020-09-26 NOTE — Assessment & Plan Note (Addendum)
bp in fair control at this time  BP Readings from Last 1 Encounters:  09/26/20 128/74   No changes needed Most recent labs reviewed  Disc lifstyle change with low sodium diet and exercise  Plan to continue hctz 25 mg daily  Labs ordered

## 2020-09-26 NOTE — Assessment & Plan Note (Signed)
Discussed how this problem influences overall health and the risks it imposes  Reviewed plan for weight loss with lower calorie diet (via better food choices and also portion control or program like weight watchers) and exercise building up to or more than 30 minutes 5 days per week including some aerobic activity   Commended on wt loss so far  

## 2020-09-26 NOTE — Assessment & Plan Note (Signed)
TSH and FT4 ordered No change in energy level or skin [name]

## 2020-09-26 NOTE — Progress Notes (Signed)
Subjective:    Patient ID: [name], female    DOB: [birth year], 70 y.o.   MRN: [id number]  This visit occurred during the SARS-CoV-2 public health emergency.  Safety protocols were in place, including screening questions prior to the visit, additional usage of staff PPE, and extensive cleaning of exam room while observing appropriate contact time as indicated for disinfecting solutions.   HPI Pt presents for f/u of chronic health problems including HTN and hypothyroidism and elevated glucose  Wt Readings from Last 3 Encounters:  09/26/20 223 lb (101.2 kg)  05/24/19 241 lb 9 oz (109.6 kg)  04/21/19 245 lb 7 oz (111.3 kg)   39.50 kg/m  Doing well  Cold her house in march and lives with her kids until they can get another house  May rent or buy, not sure  They are helpful with grand kids (5 and 8 mo old)   Declines covid vaccination  Is very careful  She declines shingrix  Declines flu vaccine    Has lost significant weight  In a plan with a supplement (modare) and a changed diet  Walking  Moving much more  Feels good  Goal to get under 200   HTN  bp is stable today  No cp or palpitations or headaches or edema  No side effects to medicines  BP Readings from Last 3 Encounters:  09/26/20 128/74  05/24/19 122/68  04/21/19 132/70     Taking hctz 25 mg daily  Lab Results  Component Value Date   CREATININE 0.82 04/21/2019   BUN 17 04/21/2019   NA 138 04/21/2019   K 4.4 04/21/2019   CL 98 04/21/2019   CO2 31 04/21/2019     Subclinical hypothyroidism Lab Results  Component Value Date   TSH 3.42 04/21/2019   Due for lab  Energy level is pretty good for child care  No change in skin or hair    Past elevated glucose in setting of obesity Lab Results  Component Value Date   HGBA1C 5.9 04/21/2019  Eating better and has lost significant weight    Patient Active Problem List   Diagnosis Date Noted   Hearing loss 10/27/2019   Shingles 05/24/2019    Encounter for hepatitis C screening test for low risk patient 04/21/2019   Elevated random blood glucose level 12/16/2017   Subclinical hypothyroidism 12/16/2017   Essential hypertension 11/28/2017   Localized deposits of fat 11/30/2014   Obesity 11/30/2014   Arthritis of knee, right 02/04/2014   History of anemia 12/06/2013   History of pulmonary embolism 01/02/2010   Past Medical History:  Diagnosis Date   Allergic rhinitis    Family history of diabetes mellitus    History of pulmonary embolus (PE) 11/2009   post-op [facility] PE   Osteoarthritis    "both knees" (02/04/2014)   Other specified congenital anomaly of skin    Peripheral vascular disease (HCC)    Urinary frequency    Past Surgical History:  Procedure Laterality Date   JOINT REPLACEMENT     KNEE ARTHROSCOPY Bilateral X 3   "2 on one side; one on the other"   TONSILLECTOMY     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   coumadin x 6 months   TOTAL KNEE ARTHROPLASTY Right 02/04/2014   TOTAL KNEE ARTHROPLASTY Right 02/04/2014   Procedure: RIGHT TOTAL KNEE ARTHROPLASTY;  Surgeon: Verlee Rossetti, MD;  Location: [facility] OR;  Service: Orthopedics;  Laterality: Right;   TUBAL LIGATION  1983  Uterine Biopsy     RE: fibroid tumors   Social History   Tobacco Use   Smoking status: Never   Smokeless tobacco: Never  Substance Use Topics   Alcohol use: Yes    Alcohol/week: 0.0 standard drinks    Comment: 02/04/2014 "maybe once/month I'll have a mixed drink or ffruit lavored drink"   Drug use: No   Family History  Problem Relation Age of Onset   Cervical cancer Mother    Diabetes type II Mother    Hiatal hernia Mother    Coronary artery disease Father        CABG   Diabetes Father    Cancer Father        bladder   Arrhythmia Father    Gout Father    Osteoarthritis Paternal Grandmother    Cancer Paternal Grandfather    Diabetes Paternal Grandfather    Parkinsonism Maternal Grandmother    Other Maternal Grandfather         "fluid in his lungs"   Breast cancer Neg Hx    Allergies  Allergen Reactions   Penicillins Swelling   No current outpatient medications on file prior to visit.   No current facility-administered medications on file prior to visit.     Review of Systems  Constitutional:  Negative for activity change, appetite change, fatigue, fever and unexpected weight change.  HENT:  Negative for congestion, ear pain, rhinorrhea, sinus pressure and sore throat.   Eyes:  Negative for pain, redness and visual disturbance.  Respiratory:  Negative for cough, shortness of breath and wheezing.   Cardiovascular:  Negative for chest pain and palpitations.  Gastrointestinal:  Negative for abdominal pain, blood in stool, constipation and diarrhea.  Endocrine: Negative for polydipsia and polyuria.  Genitourinary:  Negative for dysuria, frequency and urgency.  Musculoskeletal:  Negative for arthralgias, back pain and myalgias.  Skin:  Negative for pallor and rash.  Allergic/Immunologic: Negative for environmental allergies.  Neurological:  Negative for dizziness, syncope and headaches.  Hematological:  Negative for adenopathy. Does not bruise/bleed easily.  Psychiatric/Behavioral:  Negative for decreased concentration and dysphoric mood. The patient is not nervous/anxious.       Objective:   Physical Exam Constitutional:      General: She is not in acute distress.    Appearance: Normal appearance. She is well-developed. She is obese. She is not ill-appearing.  HENT:     Head: Normocephalic and atraumatic.  Eyes:     Conjunctiva/sclera: Conjunctivae normal.     Pupils: Pupils are equal, round, and reactive to light.  Neck:     Thyroid: No thyromegaly.     Vascular: No carotid bruit or JVD.  Cardiovascular:     Rate and Rhythm: Normal rate and regular rhythm.     Heart sounds: Normal heart sounds.    No gallop.  Pulmonary:     Effort: Pulmonary effort is normal. No respiratory distress.     Breath  sounds: Normal breath sounds. No wheezing or rales.  Abdominal:     General: Bowel sounds are normal. There is no distension or abdominal bruit.     Palpations: Abdomen is soft. There is no mass.     Tenderness: There is no abdominal tenderness.  Musculoskeletal:     Cervical back: Normal range of motion and neck supple.     Right lower leg: No edema.     Left lower leg: No edema.  Lymphadenopathy:     Cervical: No cervical adenopathy.  Skin:    General: Skin is warm and dry.     Coloration: Skin is not pale.     Findings: No rash.  Neurological:     Mental Status: She is alert.     Coordination: Coordination normal.     Deep Tendon Reflexes: Reflexes are normal and symmetric. Reflexes normal.  Psychiatric:        Mood and Affect: Mood normal.        Cognition and Memory: Cognition and memory normal.          Assessment & Plan:   Problem List Items Addressed This Visit       Cardiovascular and Mediastinum   Essential hypertension - Primary    bp in fair control at this time  BP Readings from Last 1 Encounters:  09/26/20 128/74  No changes needed Most recent labs reviewed  Disc lifstyle change with low sodium diet and exercise  Plan to continue hctz 25 mg daily  Labs ordered        Relevant Medications   hydrochlorothiazide (HYDRODIURIL) 25 MG tablet   Other Relevant Orders   CBC with Differential/Platelet   Comprehensive metabolic panel   Lipid panel   TSH     Endocrine   Subclinical hypothyroidism    TSH and FT4 ordered No change in energy level or skin /hair       Relevant Orders   TSH   T4, free     Other   Obesity    Discussed how this problem influences overall health and the risks it imposes  Reviewed plan for weight loss with lower calorie diet (via better food choices and also portion control or program like weight watchers) and exercise building up to or more than 30 minutes 5 days per week including some aerobic activity   Commended on  wt loss so far        Elevated random blood glucose level    A1C ordered today  disc imp of low glycemic diet and wt loss to prevent DM2  Commended on wt loss so far       Relevant Orders   Hemoglobin A1c

## 2020-09-26 NOTE — Assessment & Plan Note (Signed)
A1C ordered today  disc imp of low glycemic diet and wt loss to prevent DM2  Commended on wt loss so far

## 2020-09-27 LAB — CBC WITH DIFFERENTIAL/PLATELET
Basophils Absolute: 0.1 10*3/uL (ref 0.0–0.1)
Basophils Relative: 0.8 % (ref 0.0–3.0)
Eosinophils Absolute: 0.4 10*3/uL (ref 0.0–0.7)
Eosinophils Relative: 3.8 % (ref 0.0–5.0)
HCT: 39.1 % (ref 36.0–46.0)
Hemoglobin: 13 g/dL (ref 12.0–15.0)
Lymphocytes Relative: 28.5 % (ref 12.0–46.0)
Lymphs Abs: 3.1 10*3/uL (ref 0.7–4.0)
MCHC: 33.3 g/dL (ref 30.0–36.0)
MCV: 87.2 fl (ref 78.0–100.0)
Monocytes Absolute: 0.8 10*3/uL (ref 0.1–1.0)
Monocytes Relative: 7.5 % (ref 3.0–12.0)
Neutro Abs: 6.5 10*3/uL (ref 1.4–7.7)
Neutrophils Relative %: 59.4 % (ref 43.0–77.0)
Platelets: 335 10*3/uL (ref 150.0–400.0)
RBC: 4.48 Mil/uL (ref 3.87–5.11)
RDW: 14.2 % (ref 11.5–15.5)
WBC: 10.9 10*3/uL — ABNORMAL HIGH (ref 4.0–10.5)

## 2020-09-27 LAB — COMPREHENSIVE METABOLIC PANEL
ALT: 9 U/L (ref 0–35)
AST: 12 U/L (ref 0–37)
Albumin: 4.2 g/dL (ref 3.5–5.2)
Alkaline Phosphatase: 110 U/L (ref 39–117)
BUN: 17 mg/dL (ref 6–23)
CO2: 32 mEq/L (ref 19–32)
Calcium: 9.9 mg/dL (ref 8.4–10.5)
Chloride: 100 mEq/L (ref 96–112)
Creatinine, Ser: 0.82 mg/dL (ref 0.40–1.20)
GFR: 71.65 mL/min (ref >60.00)
Glucose, Bld: 92 mg/dL (ref 70–99)
Potassium: 4.6 mEq/L (ref 3.5–5.1)
Sodium: 141 mEq/L (ref 135–145)
Total Bilirubin: 1.2 mg/dL (ref 0.2–1.2)
Total Protein: 7.1 g/dL (ref 6.0–8.3)

## 2020-09-27 LAB — HEMOGLOBIN A1C: Hgb A1c MFr Bld: 5.9 % (ref 4.6–6.5)

## 2020-09-27 LAB — LIPID PANEL
Cholesterol: 183 mg/dL (ref 0–200)
HDL: 53.1 mg/dL (ref >39.00)
LDL Cholesterol: 116 mg/dL — ABNORMAL HIGH (ref 0–99)
NonHDL: 130.34
Total CHOL/HDL Ratio: 3
Triglycerides: 73 mg/dL (ref 0.0–149.0)
VLDL: 14.6 mg/dL (ref 0.0–40.0)

## 2020-09-27 LAB — TSH: TSH: 3.14 u[IU]/mL (ref 0.35–4.50)

## 2020-09-27 LAB — T4, FREE: Free T4: 0.97 ng/dL (ref 0.60–1.60)

## 2020-09-28 ENCOUNTER — Encounter: Payer: Self-pay | Admitting: *Deleted

## 2021-05-08 NOTE — Progress Notes (Signed)
Subjective:   [name] is a 71 y.o. female who presents for Medicare Annual (Subsequent) preventive examination.  I connected with [name] today by telephone and verified that I am speaking with the correct person using two identifiers. Location patient: home Location provider: work Persons participating in the virtual visit: patient, Engineer, civil (consulting).    I discussed the limitations, risks, security and privacy concerns of performing an evaluation and management service by telephone and the availability of in person appointments. I also discussed with the patient that there may be a patient responsible charge related to this service. The patient expressed understanding and verbally consented to this telephonic visit.    Interactive audio and video telecommunications were attempted between this provider and patient, however failed, due to patient having technical difficulties OR patient did not have access to video capability.  We continued and completed visit with audio only.  Some vital signs may be absent or patient reported.   Time Spent with patient on telephone encounter: 25 minutes  Review of Systems     Cardiac Risk Factors include: advanced age (>3men, >65 women);hypertension     Objective:    Today's Vitals   05/09/21 1320  Weight: 223 lb (101.2 kg)  Height: 5\' 3"  (1.6 m)   Body mass index is 39.5 kg/m.  Advanced Directives 05/09/2021 08/17/2019 11/27/2017 02/04/2014 01/26/2014  Does Patient Have a Medical Advance Directive? Yes Yes No No No  Type of 01/28/2014 of [place];Living will Healthcare Power of [place];Living will - - -  Does patient want to make changes to medical advance directive? Yes (MAU/Ambulatory/Procedural Areas - Information given) - - - -  Copy of Healthcare Power of Attorney in Chart? - No - copy requested - - -  Would patient like information on creating a medical advance directive? - - - No - patient declined information No -  patient declined information    Current Medications (verified) Outpatient Encounter Medications as of 05/09/2021  Medication Sig   hydrochlorothiazide (HYDRODIURIL) 25 MG tablet Take 1 tablet (25 mg total) by mouth daily.   No facility-administered encounter medications on file as of 05/09/2021.    Allergies (verified) Penicillins   History: Past Medical History:  Diagnosis Date   Allergic rhinitis    Family history of diabetes mellitus    History of pulmonary embolus (PE) 11/2009   post-op LLL PE   Osteoarthritis    "both knees" (02/04/2014)   Other specified congenital anomaly of skin    Peripheral vascular disease (HCC)    Urinary frequency    Past Surgical History:  Procedure Laterality Date   JOINT REPLACEMENT     KNEE ARTHROSCOPY Bilateral X 3   "2 on one side; one on the other"   TONSILLECTOMY     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   coumadin x 6 months   TOTAL KNEE ARTHROPLASTY Right 02/04/2014   TOTAL KNEE ARTHROPLASTY Right 02/04/2014   Procedure: RIGHT TOTAL KNEE ARTHROPLASTY;  Surgeon: 02/06/2014, MD;  Location: [facility] OR;  Service: Orthopedics;  Laterality: Right;   TUBAL LIGATION  1983   Uterine Biopsy     RE: fibroid tumors   Family History  Problem Relation Age of Onset   Cervical cancer Mother    Diabetes type II Mother    Hiatal hernia Mother    Coronary artery disease Father        CABG   Diabetes Father    Cancer Father  bladder   Arrhythmia Father    Gout Father    Osteoarthritis Paternal Grandmother    Cancer Paternal Grandfather    Diabetes Paternal Grandfather    Parkinsonism Maternal Grandmother    Other Maternal Grandfather        "fluid in his lungs"   Breast cancer Neg Hx    Social History   Socioeconomic History   Marital status: Married    Spouse name: Not on file   Number of children: Not on file   Years of education: Not on file   Highest education level: Not on file  Occupational History   Not on file   Tobacco Use   Smoking status: Never   Smokeless tobacco: Never  Substance and Sexual Activity   Alcohol use: Yes    Alcohol/week: 0.0 standard drinks    Comment: 02/04/2014 "maybe once/month I'll have a mixed drink or ffruit lavored drink"   Drug use: No   Sexual activity: Yes  Other Topics Concern   Not on file  Social History Narrative   Not on file   Social Determinants of Health   Financial Resource Strain: Low Risk    Difficulty of Paying Living Expenses: Not hard at all  Food Insecurity: No Food Insecurity   Worried About Programme researcher, broadcasting/film/video in the Last Year: Never true   Ran Out of Food in the Last Year: Never true  Transportation Needs: No Transportation Needs   Lack of Transportation (Medical): No   Lack of Transportation (Non-Medical): No  Physical Activity: Sufficiently Active   Days of Exercise per Week: 7 days   Minutes of Exercise per Session: 150+ min  Stress: No Stress Concern Present   Feeling of Stress : Not at all  Social Connections: Moderately Integrated   Frequency of Communication with Friends and Family: More than three times a week   Frequency of Social Gatherings with Friends and Family: More than three times a week   Attends Religious Services: 1 to 4 times per year   Active Member of [employer] or Organizations: No   Attends Engineer, structural: Never   Marital Status: Married    Tobacco Counseling Counseling given: Not Answered   Clinical Intake:  Pre-visit preparation completed: Yes  Pain : No/denies pain     BMI - recorded: 39.5 Nutritional Status: BMI > 30  Obese Nutritional Risks: None Diabetes: No  How often do you need to have someone help you when you read instructions, pamphlets, or other written materials from your doctor or pharmacy?: 1 - Never  Diabetic? No  Interpreter Needed?: No  Information entered by :: Sharen Heck LPN   Activities of Daily Living In your present state of health, do you have any  difficulty performing the following activities: 05/09/2021 09/26/2020  Hearing? Malvin Johns  Comment wears hearing aids has hearing aids  Vision? N N  Difficulty concentrating or making decisions? N N  Walking or climbing stairs? N N  Dressing or bathing? N N  Doing errands, shopping? N N  Preparing Food and eating ? N -  Using the Toilet? N -  In the past six months, have you accidently leaked urine? N -  Do you have problems with loss of bowel control? N -  Managing your Medications? N -  Managing your Finances? N -  Housekeeping or managing your Housekeeping? N -  Some recent data might be hidden    Patient Care Team: Tower, Audrie Gallus, MD as  PCP - General  Indicate any recent Medical Services you may have received from other than [facility] providers in the past year (date may be approximate).     Assessment:   This is a routine wellness examination for [name].  Hearing/Vision screen Hearing Screening - Comments:: Wears hearing aids Vision Screening - Comments:: Last exam 2 years ago, plans to make an appointment  Dietary issues and exercise activities discussed: Current Exercise Habits: The patient has a physically strenuous job, but has no regular exercise apart from work. (takes care of grandchildren)   Goals Addressed             This Visit's Progress    Patient Stated       Would like to maintain a current routine        Depression Screen PHQ 2/9 Scores 05/09/2021 09/26/2020 08/17/2019 11/28/2017  PHQ - 2 Score 0 0 0 0  PHQ- 9 Score - 0 0 -    Fall Risk Fall Risk  05/09/2021 09/26/2020 08/17/2019 11/28/2017 11/25/2017  Falls in the past year? 1 0 0 No No  Comment - - - - -  Number falls in past yr: 0 0 0 - -  Injury with Fall? 0 0 0 - -  Risk for fall due to : No Fall Risks - No Fall Risks - -  Follow up Falls prevention discussed - Falls evaluation completed;Falls prevention discussed - -    FALL RISK PREVENTION PERTAINING TO THE HOME:  Any stairs in or around the home? Yes   If so, are there any without handrails? No  Home free of loose throw rugs in walkways, pet beds, electrical cords, etc? Yes  Adequate lighting in your home to reduce risk of falls? Yes   ASSISTIVE DEVICES UTILIZED TO PREVENT FALLS:  Life alert? No  Use of a cane, walker or w/c? No  Grab bars in the bathroom? Yes  Shower chair or bench in shower? Yes  Elevated toilet seat or a handicapped toilet? No   TIMED UP AND GO:  Was the test performed? No .     Cognitive Function: Normal cognitive status assessed by this Nurse Health Advisor. No abnormalities found.   MMSE - Mini Mental State Exam 08/17/2019  Orientation to time 5  Orientation to Place 5  Registration 3  Attention/ Calculation 5  Recall 3  Language- repeat 1        Immunizations  There is no immunization history on file for this patient.  TDAP status: Due, Education has been provided regarding the importance of this vaccine. Advised may receive this vaccine at local pharmacy or Health Dept. Aware to provide a copy of the vaccination record if obtained from local pharmacy or Health Dept. Verbalized acceptance and understanding.  Flu Vaccine status: Declined, Education has been provided regarding the importance of this vaccine but patient still declined. Advised may receive this vaccine at local pharmacy or Health Dept. Aware to provide a copy of the vaccination record if obtained from local pharmacy or Health Dept. Verbalized acceptance and understanding.  Pneumococcal vaccine status: Declined,  Education has been provided regarding the importance of this vaccine but patient still declined. Advised may receive this vaccine at local pharmacy or Health Dept. Aware to provide a copy of the vaccination record if obtained from local pharmacy or Health Dept. Verbalized acceptance and understanding.   Covid-19 vaccine status: Declined, Education has been provided regarding the importance of this vaccine but patient still  declined. Advised  may receive this vaccine at local pharmacy or Health Dept.or vaccine clinic. Aware to provide a copy of the vaccination record if obtained from local pharmacy or Health Dept. Verbalized acceptance and understanding.  Qualifies for Shingles Vaccine? Yes   Zostavax completed No   Shingrix Completed?: No.    Education has been provided regarding the importance of this vaccine. Patient has been advised to call insurance company to determine out of pocket expense if they have not yet received this vaccine. Advised may also receive vaccine at local pharmacy or Health Dept. Verbalized acceptance and understanding.  Screening Tests Health Maintenance  Topic Date Due   Pneumonia Vaccine 5865+ Years old (1 - PCV) Never done   TETANUS/TDAP  Never done   Zoster Vaccines- Shingrix (1 of 2) Never done   COLONOSCOPY (Pts 45-3262yrs Insurance coverage will need to be confirmed)  Never done   DEXA SCAN  Never done   INFLUENZA VACCINE  Never done   COVID-19 Vaccine (1) 09/26/2021 (Originally 03/16/1951)   MAMMOGRAM  09/07/2021   Hepatitis C Screening  Completed   HPV VACCINES  Aged Out    Health Maintenance  Health Maintenance Due  Topic Date Due   Pneumonia Vaccine 6765+ Years old (1 - PCV) Never done   TETANUS/TDAP  Never done   Zoster Vaccines- Shingrix (1 of 2) Never done   COLONOSCOPY (Pts 45-3862yrs Insurance coverage will need to be confirmed)  Never done   DEXA SCAN  Never done   INFLUENZA VACCINE  Never done    Colorectal cancer screening: patient plans to discuss with PCP  Mammogram status: Completed 09/07/20. Repeat every year  Bone Density status: Patient plans to discuss with PCP  Lung Cancer Screening: (Low Dose CT Chest recommended if Age 21-80 years, 30 pack-year currently smoking OR have quit w/in 15years.) does not qualify.     Additional Screening:  Hepatitis C Screening: does qualify; Completed 04/21/19  Vision Screening: Recommended annual ophthalmology exams  for early detection of glaucoma and other disorders of the eye. Is the patient up to date with their annual eye exam?  No  Who is the provider or what is the name of the office in which the patient attends annual eye exams? Provider information unavailable.   Dental Screening: Recommended annual dental exams for proper oral hygiene  Community Resource Referral / Chronic Care Management: CRR required this visit?  No   CCM required this visit?  No      Plan:     I have personally reviewed and noted the following in the patients chart:   Medical and social history Use of alcohol, tobacco or illicit drugs  Current medications and supplements including opioid prescriptions.  Functional ability and status Nutritional status Physical activity Advanced directives List of other physicians Hospitalizations, surgeries, and ER visits in previous 12 months Vitals Screenings to include cognitive, depression, and falls Referrals and appointments  In addition, I have reviewed and discussed with patient certain preventive protocols, quality metrics, and best practice recommendations. A written personalized care plan for preventive services as well as general preventive health recommendations were provided to patient.   Due to this being a telephonic visit, the after visit summary with patients personalized plan was offered to patient via mail or my-chart.  Patient preferred to pick up at office at next visit.   Janne Labamina A Jearlene Bridwell, LPN  [id number]/25/2023   Nurse Health Advisor  Nurse Notes: none

## 2021-05-09 ENCOUNTER — Ambulatory Visit (INDEPENDENT_AMBULATORY_CARE_PROVIDER_SITE_OTHER): Payer: Medicare Other

## 2021-05-09 VITALS — Ht 63.0 in | Wt 223.0 lb

## 2021-05-09 DIAGNOSIS — Z0000 Encounter for general adult medical examination without abnormal findings: Secondary | ICD-10-CM

## 2021-05-09 NOTE — Patient Instructions (Signed)
Ms. [name] , Thank you for taking time to complete your Medicare Wellness Visit. I appreciate your ongoing commitment to your health goals. Please review the following plan we discussed and let me know if I can assist you in the future.   Screening recommendations/referrals: Colonoscopy: discuss with PCP if you change your mind  Mammogram: up to date, 09/07/20, due 09/07/21 Bone Density: discuss with PCP if you change your mind  Recommended yearly ophthalmology/optometry visit for glaucoma screening and checkup Recommended yearly dental visit for hygiene and checkup  Vaccinations: Influenza vaccine: Due-May obtain vaccine at our office or your local pharmacy. Pneumococcal vaccine: Due-May obtain vaccine at our office or your local pharmacy. Tdap vaccine: Due-May obtain vaccine at your local pharmacy. Shingles vaccine: May obtain vaccine at your local pharmacy.   Covid-19:newest booster available at your local pharmacy  Advanced directives: Please bring a copy of Living Will and/or [place] for your chart.   Conditions/risks identified: see problem list   Next appointment: Follow up in one year for your annual wellness visit 05/13/22 @ 1:15am, this will be a telephone visit.    Preventive Care 80 Years and Older, Female Preventive care refers to lifestyle choices and visits with your health care provider that can promote health and wellness. What does preventive care include? A yearly physical exam. This is also called an annual well check. Dental exams once or twice a year. Routine eye exams. Ask your health care provider how often you should have your eyes checked. Personal lifestyle choices, including: Daily care of your teeth and gums. Regular physical activity. Eating a healthy diet. Avoiding tobacco and drug use. Limiting alcohol use. Practicing safe sex. Taking low-dose aspirin every day. Taking vitamin and mineral supplements as recommended by your  health care provider. What happens during an annual well check? The services and screenings done by your health care provider during your annual well check will depend on your age, overall health, lifestyle risk factors, and family history of disease. Counseling  Your health care provider may ask you questions about your: Alcohol use. Tobacco use. Drug use. Emotional well-being. Home and relationship well-being. Sexual activity. Eating habits. History of falls. Memory and ability to understand (cognition). Work and work Statistician. Reproductive health. Screening  You may have the following tests or measurements: Height, weight, and BMI. Blood pressure. Lipid and cholesterol levels. These may be checked every 5 years, or more frequently if you are over 40 years old. Skin check. Lung cancer screening. You may have this screening every year starting at age 54 if you have a 30-pack-year history of smoking and currently smoke or have quit within the past 15 years. Fecal occult blood test (FOBT) of the stool. You may have this test every year starting at age 82. Flexible sigmoidoscopy or colonoscopy. You may have a sigmoidoscopy every 5 years or a colonoscopy every 10 years starting at age 48. Hepatitis C blood test. Hepatitis B blood test. Sexually transmitted disease (STD) testing. Diabetes screening. This is done by checking your blood sugar (glucose) after you have not eaten for a while (fasting). You may have this done every 1-3 years. Bone density scan. This is done to screen for osteoporosis. You may have this done starting at age 3. Mammogram. This may be done every 1-2 years. Talk to your health care provider about how often you should have regular mammograms. Talk with your health care provider about your test results, treatment options, and if necessary, the need  for more tests. Vaccines  Your health care provider may recommend certain vaccines, such as: Influenza vaccine.  This is recommended every year. Tetanus, diphtheria, and acellular pertussis (Tdap, Td) vaccine. You may need a Td booster every 10 years. Zoster vaccine. You may need this after age 20. Pneumococcal 13-valent conjugate (PCV13) vaccine. One dose is recommended after age 69. Pneumococcal polysaccharide (PPSV23) vaccine. One dose is recommended after age 50. Talk to your health care provider about which screenings and vaccines you need and how often you need them. This information is not intended to replace advice given to you by your health care provider. Make sure you discuss any questions you have with your health care provider. Document Released: 04/28/2015 Document Revised: 12/20/2015 Document Reviewed: 01/31/2015 Elsevier Interactive Patient Education  2017 [place] Prevention in the Home Falls can cause injuries. They can happen to people of all ages. There are many things you can do to make your home safe and to help prevent falls. What can I do on the outside of my home? Regularly fix the edges of walkways and driveways and fix any cracks. Remove anything that might make you trip as you walk through a door, such as a raised step or threshold. Trim any bushes or trees on the path to your home. Use bright outdoor lighting. Clear any walking paths of anything that might make someone trip, such as rocks or tools. Regularly check to see if handrails are loose or broken. Make sure that both sides of any steps have handrails. Any raised decks and porches should have guardrails on the edges. Have any leaves, snow, or ice cleared regularly. Use sand or salt on walking paths during winter. Clean up any spills in your garage right away. This includes oil or grease spills. What can I do in the bathroom? Use night lights. Install grab bars by the toilet and in the tub and shower. Do not use towel bars as grab bars. Use non-skid mats or decals in the tub or shower. If you need to sit  down in the shower, use a plastic, non-slip stool. Keep the floor dry. Clean up any water that spills on the floor as soon as it happens. Remove soap buildup in the tub or shower regularly. Attach bath mats securely with double-sided non-slip rug tape. Do not have throw rugs and other things on the floor that can make you trip. What can I do in the bedroom? Use night lights. Make sure that you have a light by your bed that is easy to reach. Do not use any sheets or blankets that are too big for your bed. They should not hang down onto the floor. Have a firm chair that has side arms. You can use this for support while you get dressed. Do not have throw rugs and other things on the floor that can make you trip. What can I do in the kitchen? Clean up any spills right away. Avoid walking on wet floors. Keep items that you use a lot in easy-to-reach places. If you need to reach something above you, use a strong step stool that has a grab bar. Keep electrical cords out of the way. Do not use floor polish or wax that makes floors slippery. If you must use wax, use non-skid floor wax. Do not have throw rugs and other things on the floor that can make you trip. What can I do with my stairs? Do not leave any items on the stairs.  Make sure that there are handrails on both sides of the stairs and use them. Fix handrails that are broken or loose. Make sure that handrails are as long as the stairways. Check any carpeting to make sure that it is firmly attached to the stairs. Fix any carpet that is loose or worn. Avoid having throw rugs at the top or bottom of the stairs. If you do have throw rugs, attach them to the floor with carpet tape. Make sure that you have a light switch at the top of the stairs and the bottom of the stairs. If you do not have them, ask someone to add them for you. What else can I do to help prevent falls? Wear shoes that: Do not have high heels. Have rubber bottoms. Are  comfortable and fit you well. Are closed at the toe. Do not wear sandals. If you use a stepladder: Make sure that it is fully opened. Do not climb a closed stepladder. Make sure that both sides of the stepladder are locked into place. Ask someone to hold it for you, if possible. Clearly mark and make sure that you can see: Any grab bars or handrails. First and last steps. Where the edge of each step is. Use tools that help you move around (mobility aids) if they are needed. These include: Canes. Walkers. Scooters. Crutches. Turn on the lights when you go into a dark area. Replace any light bulbs as soon as they burn out. Set up your furniture so you have a clear path. Avoid moving your furniture around. If any of your floors are uneven, fix them. If there are any pets around you, be aware of where they are. Review your medicines with your doctor. Some medicines can make you feel dizzy. This can increase your chance of falling. Ask your doctor what other things that you can do to help prevent falls. This information is not intended to replace advice given to you by your health care provider. Make sure you discuss any questions you have with your health care provider. Document Released: 01/26/2009 Document Revised: 09/07/2015 Document Reviewed: 05/06/2014 Elsevier Interactive Patient Education  2017 [employer].

## 2021-07-30 DIAGNOSIS — H2513 Age-related nuclear cataract, bilateral: Secondary | ICD-10-CM | POA: Diagnosis not present

## 2021-07-30 DIAGNOSIS — H02824 Cysts of left upper eyelid: Secondary | ICD-10-CM | POA: Diagnosis not present

## 2021-09-05 ENCOUNTER — Other Ambulatory Visit: Payer: Self-pay | Admitting: Family Medicine

## 2021-09-05 DIAGNOSIS — Z1231 Encounter for screening mammogram for malignant neoplasm of breast: Secondary | ICD-10-CM

## 2021-09-13 ENCOUNTER — Ambulatory Visit
Admission: RE | Admit: 2021-09-13 | Discharge: 2021-09-13 | Disposition: A | Discharge status: Home / Self Care | Payer: Medicare Other | Source: Ambulatory Visit | Attending: Family Medicine | Admitting: Family Medicine

## 2021-09-13 DIAGNOSIS — Z1231 Encounter for screening mammogram for malignant neoplasm of breast: Secondary | ICD-10-CM | POA: Diagnosis not present

## 2021-09-25 ENCOUNTER — Telehealth: Payer: Self-pay | Admitting: Family Medicine

## 2021-09-26 NOTE — Telephone Encounter (Signed)
I called pt to schedule but she was unable to schedule at the moment due to her going out of town but said she will call back within the next few weeks to schedule

## 2021-09-26 NOTE — Telephone Encounter (Signed)
Noted  

## 2021-09-26 NOTE — Telephone Encounter (Signed)
Please schedule as instructed by Dr. Milinda Antis.

## 2021-09-26 NOTE — Telephone Encounter (Signed)
Refill request HCTZ  Last office visit [facility] 05/09/21 Last refill 09/26/20 #90/3 No upcoming about with Dr. Glori Bickers

## 2021-09-26 NOTE — Telephone Encounter (Signed)
I refilled it once  Please schedule annual visit this summer

## 2021-12-13 DIAGNOSIS — H903 Sensorineural hearing loss, bilateral: Secondary | ICD-10-CM | POA: Diagnosis not present

## 2021-12-27 ENCOUNTER — Other Ambulatory Visit: Payer: Self-pay | Admitting: Family Medicine

## 2022-05-13 ENCOUNTER — Ambulatory Visit (INDEPENDENT_AMBULATORY_CARE_PROVIDER_SITE_OTHER): Payer: Medicare Other

## 2022-05-13 VITALS — Ht 63.0 in | Wt 223.0 lb

## 2022-05-13 DIAGNOSIS — Z0000 Encounter for general adult medical examination without abnormal findings: Secondary | ICD-10-CM | POA: Diagnosis not present

## 2022-05-13 NOTE — Progress Notes (Signed)
Subjective:   [name] is a 72 y.o. female who presents for Medicare Annual (Subsequent) preventive examination.  Review of Systems    No ROS.  Medicare Wellness Virtual Visit.  Visual/audio telehealth visit, UTA vital signs.   See social history for additional risk factors.   Cardiac Risk Factors include: advanced age (>46men, >36 women);hypertension     Objective:    Today's Vitals   05/13/22 1312  Weight: 223 lb (101.2 kg)  Height: 5\' 3"  (1.6 m)   Body mass index is 39.5 kg/m.     05/13/2022    1:05 PM 05/09/2021    1:24 PM 08/17/2019   12:03 PM 11/27/2017    1:20 PM 02/04/2014   10:52 PM 01/26/2014    1:14 PM  Advanced Directives  Does Patient Have a Medical Advance Directive? Yes Yes Yes No No No  Type of Paramedic of [place];Living will [place];Living will [place];Living will     Does patient want to make changes to medical advance directive? No - Patient declined Yes (MAU/Ambulatory/Procedural Areas - Information given)      Copy of [place] in Chart? No - copy requested  No - copy requested     Would patient like information on creating a medical advance directive?     No - patient declined information No - patient declined information    Current Medications (verified) Outpatient Encounter Medications as of 05/13/2022  Medication Sig   hydrochlorothiazide (HYDRODIURIL) 25 MG tablet TAKE 1 TABLET (25 MG TOTAL) BY MOUTH DAILY.   No facility-administered encounter medications on file as of 05/13/2022.    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   coumadin x 6 months   TOTAL KNEE ARTHROPLASTY Right 02/04/2014   TOTAL KNEE ARTHROPLASTY Right 02/04/2014   Procedure: RIGHT TOTAL KNEE ARTHROPLASTY;  Surgeon: Augustin Schooling, MD;  Location: [facility];  Service: Orthopedics;  Laterality: Right;   TUBAL LIGATION  1983   Uterine Biopsy     RE: fibroid tumors   Family History  Problem Relation Age of Onset   Cervical cancer Mother    Diabetes type II Mother    Hiatal hernia Mother    Coronary artery disease Father        CABG   Diabetes Father    Cancer Father        bladder   Arrhythmia Father    Gout Father    Osteoarthritis Paternal Grandmother    Cancer Paternal Grandfather    Diabetes Paternal Grandfather    Parkinsonism Maternal Grandmother    Other Maternal Grandfather        "fluid in his lungs"   Breast cancer Neg Hx    Social History   Socioeconomic History   Marital status: Married    Spouse name: Not on file  Number of children: Not on file   Years of education: Not on file   Highest education level: Not on file  Occupational History   Not on file  Tobacco Use   Smoking status: Never   Smokeless tobacco: Never  Substance and Sexual Activity   Alcohol use: Yes    Alcohol/week: 0.0 standard drinks of alcohol    Comment: 02/04/2014 "maybe once/month I'll have a mixed drink or ffruit lavored drink"   Drug use: No   Sexual activity: Yes  Other Topics Concern   Not on file  Social History Narrative   Not on file   Social Determinants of Health   Financial Resource Strain: Low Risk  (05/13/2022)   Overall Financial Resource Strain (CARDIA)    Difficulty of Paying Living Expenses: Not hard at all  Food Insecurity: No Food Insecurity (05/13/2022)   Hunger Vital Sign    Worried About Running Out of Food in the Last  Year: Never true    Ran Out of Food in the Last Year: Never true  Transportation Needs: No Transportation Needs (05/13/2022)   PRAPARE - Administrator, Civil Service (Medical): No    Lack of Transportation (Non-Medical): No  Physical Activity: Sufficiently Active (05/13/2022)   Exercise Vital Sign    Days of Exercise per Week: 7 days    Minutes of Exercise per Session: 30 min  Stress: No Stress Concern Present (05/13/2022)   [employer] of Occupational Health - Occupational Stress Questionnaire    Feeling of Stress : Only a little  Social Connections: Unknown (05/13/2022)   Social Connection and Isolation Panel [NHANES]    Frequency of Communication with Friends and Family: More than three times a week    Frequency of Social Gatherings with Friends and Family: Once a week    Attends Religious Services: Not on Marketing executive or Organizations: No    Attends Engineer, structural: 1 to 4 times per year    Marital Status: Married    Tobacco Counseling Counseling given: Not Answered   Clinical Intake:  Pre-visit preparation completed: Yes        Diabetes: No  How often do you need to have someone help you when you read instructions, pamphlets, or other written materials from your doctor or pharmacy?: 1 - Never    Interpreter Needed?: No      Activities of Daily Living    05/13/2022    1:07 PM 05/10/2022    7:57 AM  In your present state of health, do you have any difficulty performing the following activities:  Hearing? 0 0  Vision? 0 0  Difficulty concentrating or making decisions? 0 0  Walking or climbing stairs? 0 0  Comment Paces self when walking stairs.   Dressing or bathing? 0 0  Doing errands, shopping? 0 0  Preparing Food and eating ? N N  Using the Toilet? N N  In the past six months, have you accidently leaked urine? N N  Do you have problems with loss of bowel control? N N  Managing your Medications? N N   Managing your Finances? N N  Housekeeping or managing your Housekeeping? N N    Patient Care Team: Tower, Audrie Gallus, MD as PCP - General  Indicate any recent Medical Services you may have received from other than [facility] providers in the past year (date may be approximate).     Assessment:   This is  a routine wellness examination for [name].  I connected with  [name] on 05/13/22 by a audio enabled telemedicine application and verified that I am speaking with the correct person using two identifiers.  Patient Location: Home  Provider Location: Office/Clinic  I discussed the limitations of evaluation and management by telemedicine. The patient expressed understanding and agreed to proceed.   Hearing/Vision screen Hearing Screening - Comments:: Wears hearing aids Vision Screening - Comments:: Wears reader lenses Annual visits   Dietary issues and exercise activities discussed: Current Exercise Habits: Home exercise routine, Type of exercise: walking, Time (Minutes): 45, Frequency (Times/Week): 2, Weekly Exercise (Minutes/Week): 90, Intensity: Mild Regular diet   Goals Addressed               This Visit's Progress     Patient Stated     Weight (lb) < 200 lb (90.7 kg) (pt-stated)   223 lb (101.2 kg)     Other     Patient Stated   On track     Would like to maintain a current routine        Depression Screen    05/13/2022    1:19 PM 05/09/2021    1:27 PM 09/26/2020    2:14 PM 08/17/2019   12:05 PM 11/28/2017    3:47 PM  PHQ 2/9 Scores  PHQ - 2 Score 0 0 0 0 0  PHQ- 9 Score   0 0     Fall Risk    05/13/2022    1:11 PM 05/10/2022    7:57 AM 05/09/2021    1:25 PM 09/26/2020    2:08 PM 08/17/2019   12:04 PM  Fall Risk   Falls in the past year? 1 1 1  0 0  Number falls in past yr: 0 0 0 0 0  Injury with Fall?  0 0 0 0  Risk for fall due to :   No Fall Risks  No Fall Risks  Follow up Falls evaluation completed;Falls prevention discussed  Falls prevention discussed   Falls evaluation completed;Falls prevention discussed    FALL RISK PREVENTION PERTAINING TO THE HOME: Home free of loose throw rugs in walkways, pet beds, electrical cords, etc? Yes  Adequate lighting in your home to reduce risk of falls? Yes   ASSISTIVE DEVICES UTILIZED TO PREVENT FALLS: Life alert? No  Use of a cane, walker or w/c? No   TIMED UP AND GO: Was the test performed? No .   Cognitive Function:    08/17/2019   12:06 PM  MMSE - Mini Mental State Exam  Orientation to time 5  Orientation to Place 5  Registration 3  Attention/ Calculation 5  Recall 3  Language- repeat 1        05/13/2022    1:19 PM  6CIT Screen  What Year? 0 points  What month? 0 points  What time? 0 points  Count back from 20 0 points  Months in reverse 0 points  Repeat phrase 0 points  Total Score 0 points    Immunizations  There is no immunization history on file for this patient.  TDAP status: Due, Education has been provided regarding the importance of this vaccine. Advised may receive this vaccine at local pharmacy or Health Dept. Aware to provide a copy of the vaccination record if obtained from local pharmacy or Health Dept. Verbalized acceptance and understanding.  Covid vaccine- declined.   PNA vaccine- declined.   Shingrix Completed?: No.    Education  has been provided regarding the importance of this vaccine. Patient has been advised to call insurance company to determine out of pocket expense if they have not yet received this vaccine. Advised may also receive vaccine at local pharmacy or Health Dept. Verbalized acceptance and understanding.  Screening Tests Health Maintenance  Topic Date Due   DTaP/Tdap/Td (1 - Tdap) Never done   COVID-19 Vaccine (1) 05/29/2022 (Originally 09/14/1955)   INFLUENZA VACCINE  07/14/2022 (Originally 11/13/2021)   Zoster Vaccines- Shingrix (1 of 2) 08/12/2022 (Originally 09/15/1967)   Pneumonia Vaccine 79+ Years old (1 - PCV) 05/14/2023 (Originally  09/14/2013)   DEXA SCAN  05/14/2023 (Originally 09/14/2013)   COLONOSCOPY (Pts 45-18yrs Insurance coverage will need to be confirmed)  05/14/2023 (Originally 09/14/1993)   MAMMOGRAM  09/14/2022   Medicare Annual Wellness (AWV)  05/14/2023   Hepatitis C Screening  Completed   HPV VACCINES  Aged Out    Health Maintenance Health Maintenance Due  Topic Date Due   DTaP/Tdap/Td (1 - Tdap) Never done   Colonoscopy- deferred per patient preference.   Dexa Scan- deferred due to patient preference.   Lung Cancer Screening: (Low Dose CT Chest recommended if Age 66-80 years, 30 pack-year currently smoking OR have quit w/in 15years.) does not qualify.   Hepatitis C Screening: Completed 04/2019.  Vision Screening: Recommended annual ophthalmology exams for early detection of glaucoma and other disorders of the eye.  Dental Screening: Recommended annual dental exams for proper oral hygiene.  Community Resource Referral / Chronic Care Management: CRR required this visit?  No   CCM required this visit?  No      Plan:     I have personally reviewed and noted the following in the patient's chart:   Medical and social history Use of alcohol, tobacco or illicit drugs  Current medications and supplements including opioid prescriptions. Patient is not currently taking opioid prescriptions. Functional ability and status Nutritional status Physical activity Advanced directives List of other physicians Hospitalizations, surgeries, and ER visits in previous 12 months Vitals Screenings to include cognitive, depression, and falls Referrals and appointments  In addition, I have reviewed and discussed with patient certain preventive protocols, quality metrics, and best practice recommendations. A written personalized care plan for preventive services as well as general preventive health recommendations were provided to patient.     Leta Jungling, LPN   3/61/4431

## 2022-05-13 NOTE — Patient Instructions (Addendum)
[name] , Thank you for taking time to come for your Medicare Wellness Visit. I appreciate your ongoing commitment to your health goals. Please review the following plan we discussed and let me know if I can assist you in the future.   These are the goals we discussed:  Goals Addressed               This Visit's Progress     Patient Stated     Weight (lb) < 200 lb (90.7 kg) (pt-stated)   223 lb (101.2 kg)     Other     Patient Stated   On track     Would like to maintain a current routine          This is a list of the screening recommended for you and due dates:  Health Maintenance  Topic Date Due   DTaP/Tdap/Td vaccine (1 - Tdap) Never done   COVID-19 Vaccine (1) 05/29/2022*   Flu Shot  07/14/2022*   Zoster (Shingles) Vaccine (1 of 2) 08/12/2022*   Pneumonia Vaccine (1 - PCV) 05/14/2023*   DEXA scan (bone density measurement)  05/14/2023*   Colon Cancer Screening  05/14/2023*   Mammogram  09/14/2022   Medicare Annual Wellness Visit  05/14/2023   Hepatitis C Screening: USPSTF Recommendation to screen - Ages 18-79 yo.  Completed   HPV Vaccine  Aged Out  *Topic was postponed. The date shown is not the original due date.    Advanced directives: End of life planning; Advance aging; Advanced directives discussed.  Copy of current HCPOA/Living Will requested.    Conditions/risks identified: none new.   Next appointment: Follow up in one year for your annual wellness visit    Preventive Care 65 Years and Older, Female Preventive care refers to lifestyle choices and visits with your health care provider that can promote health and wellness. What does preventive care include? A yearly physical exam. This is also called an annual well check. Dental exams once or twice a year. Routine eye exams. Ask your health care provider how often you should have your eyes checked. Personal lifestyle choices, including: Daily care of your teeth and gums. Regular physical  activity. Eating a healthy diet. Avoiding tobacco and drug use. Limiting alcohol use. Practicing safe sex. Taking low-dose aspirin every day. Taking vitamin and mineral supplements as recommended by your health care provider. What happens during an annual well check? The services and screenings done by your health care provider during your annual well check will depend on your age, overall health, lifestyle risk factors, and family history of disease. Counseling  Your health care provider may ask you questions about your: Alcohol use. Tobacco use. Drug use. Emotional well-being. Home and relationship well-being. Sexual activity. Eating habits. History of falls. Memory and ability to understand (cognition). Work and work Statistician. Reproductive health. Screening  You may have the following tests or measurements: Height, weight, and BMI. Blood pressure. Lipid and cholesterol levels. These may be checked every 5 years, or more frequently if you are over 48 years old. Skin check. Lung cancer screening. You may have this screening every year starting at age 54 if you have a 30-pack-year history of smoking and currently smoke or have quit within the past 15 years. Fecal occult blood test (FOBT) of the stool. You may have this test every year starting at age 36. Flexible sigmoidoscopy or colonoscopy. You may have a sigmoidoscopy every 5 years or a colonoscopy every 10 years starting  at age 58. Hepatitis C blood test. Hepatitis B blood test. Sexually transmitted disease (STD) testing. Diabetes screening. This is done by checking your blood sugar (glucose) after you have not eaten for a while (fasting). You may have this done every 1-3 years. Bone density scan. This is done to screen for osteoporosis. You may have this done starting at age 79. Mammogram. This may be done every 1-2 years. Talk to your health care provider about how often you should have regular mammograms. Talk with your  health care provider about your test results, treatment options, and if necessary, the need for more tests. Vaccines  Your health care provider may recommend certain vaccines, such as: Influenza vaccine. This is recommended every year. Tetanus, diphtheria, and acellular pertussis (Tdap, Td) vaccine. You may need a Td booster every 10 years. Zoster vaccine. You may need this after age 71. Pneumococcal 13-valent conjugate (PCV13) vaccine. One dose is recommended after age 34. Pneumococcal polysaccharide (PPSV23) vaccine. One dose is recommended after age 36. Talk to your health care provider about which screenings and vaccines you need and how often you need them. This information is not intended to replace advice given to you by your health care provider. Make sure you discuss any questions you have with your health care provider. Document Released: 04/28/2015 Document Revised: 12/20/2015 Document Reviewed: 01/31/2015 Elsevier Interactive Patient Education  2017 [place] Prevention in the Home Falls can cause injuries. They can happen to people of all ages. There are many things you can do to make your home safe and to help prevent falls. What can I do on the outside of my home? Regularly fix the edges of walkways and driveways and fix any cracks. Remove anything that might make you trip as you walk through a door, such as a raised step or threshold. Trim any bushes or trees on the path to your home. Use bright outdoor lighting. Clear any walking paths of anything that might make someone trip, such as rocks or tools. Regularly check to see if handrails are loose or broken. Make sure that both sides of any steps have handrails. Any raised decks and porches should have guardrails on the edges. Have any leaves, snow, or ice cleared regularly. Use sand or salt on walking paths during winter. Clean up any spills in your garage right away. This includes oil or grease spills. What can I  do in the bathroom? Use night lights. Install grab bars by the toilet and in the tub and shower. Do not use towel bars as grab bars. Use non-skid mats or decals in the tub or shower. If you need to sit down in the shower, use a plastic, non-slip stool. Keep the floor dry. Clean up any water that spills on the floor as soon as it happens. Remove soap buildup in the tub or shower regularly. Attach bath mats securely with double-sided non-slip rug tape. Do not have throw rugs and other things on the floor that can make you trip. What can I do in the bedroom? Use night lights. Make sure that you have a light by your bed that is easy to reach. Do not use any sheets or blankets that are too big for your bed. They should not hang down onto the floor. Have a firm chair that has side arms. You can use this for support while you get dressed. Do not have throw rugs and other things on the floor that can make you trip. What can  I do in the kitchen? Clean up any spills right away. Avoid walking on wet floors. Keep items that you use a lot in easy-to-reach places. If you need to reach something above you, use a strong step stool that has a grab bar. Keep electrical cords out of the way. Do not use floor polish or wax that makes floors slippery. If you must use wax, use non-skid floor wax. Do not have throw rugs and other things on the floor that can make you trip. What can I do with my stairs? Do not leave any items on the stairs. Make sure that there are handrails on both sides of the stairs and use them. Fix handrails that are broken or loose. Make sure that handrails are as long as the stairways. Check any carpeting to make sure that it is firmly attached to the stairs. Fix any carpet that is loose or worn. Avoid having throw rugs at the top or bottom of the stairs. If you do have throw rugs, attach them to the floor with carpet tape. Make sure that you have a light switch at the top of the stairs  and the bottom of the stairs. If you do not have them, ask someone to add them for you. What else can I do to help prevent falls? Wear shoes that: Do not have high heels. Have rubber bottoms. Are comfortable and fit you well. Are closed at the toe. Do not wear sandals. If you use a stepladder: Make sure that it is fully opened. Do not climb a closed stepladder. Make sure that both sides of the stepladder are locked into place. Ask someone to hold it for you, if possible. Clearly mark and make sure that you can see: Any grab bars or handrails. First and last steps. Where the edge of each step is. Use tools that help you move around (mobility aids) if they are needed. These include: Canes. Walkers. Scooters. Crutches. Turn on the lights when you go into a dark area. Replace any light bulbs as soon as they burn out. Set up your furniture so you have a clear path. Avoid moving your furniture around. If any of your floors are uneven, fix them. If there are any pets around you, be aware of where they are. Review your medicines with your doctor. Some medicines can make you feel dizzy. This can increase your chance of falling. Ask your doctor what other things that you can do to help prevent falls. This information is not intended to replace advice given to you by your health care provider. Make sure you discuss any questions you have with your health care provider. Document Released: 01/26/2009 Document Revised: 09/07/2015 Document Reviewed: 05/06/2014 Elsevier Interactive Patient Education  2017 [employer].

## 2022-08-27 ENCOUNTER — Telehealth: Payer: Self-pay

## 2022-08-27 NOTE — Patient Outreach (Signed)
  Care Coordination   Initial Visit Note   08/27/2022 Name: [name] MRN: [id number] DOB: 1950-08-03  [name] is a 72 y.o. year old female who sees Tower, Audrie Gallus, MD for primary care. I spoke with  [name] by phone today.  What matters to the patients health and wellness today?  Patient denies having nursing or community resource needs.      Goals Addressed             This Visit's Progress    COMPLETED: Care coordination activities - no follow up needed.       Interventions Today    Flowsheet Row Most Recent Value  General Interventions   General Interventions Discussed/Reviewed General Interventions Reviewed, Vaccines  [Care coordination program / services discussed. SDOH survey completed, AWV discussed. Patient advised to contact primary care provider office if care coordination services needed in future.]  Vaccines --  [Discussed importance of having vaccines and encouraged to have completed.]              SDOH assessments and interventions completed:  Yes  SDOH Interventions Today    Flowsheet Row Most Recent Value  SDOH Interventions   Food Insecurity Interventions Intervention Not Indicated  Housing Interventions Intervention Not Indicated  Transportation Interventions Intervention Not Indicated        Care Coordination Interventions:  Yes, provided   Follow up plan: No further intervention required.   Encounter Outcome:  Pt. Visit Completed   George Ina RN,BSN,CCM [facility] Care Coordination [phone] direct line

## 2022-11-02 IMAGING — MG MM DIGITAL SCREENING BILAT W/ TOMO AND CAD
8 series · 9 of 24 positions shown · non-contrast
Comparison: Previous exam(s).

CLINICAL DATA: Screening.

EXAM:
DIGITAL SCREENING BILATERAL MAMMOGRAM WITH TOMOSYNTHESIS AND CAD
TECHNIQUE: Bilateral screening digital craniocaudal and mediolateral oblique
mammograms were obtained. Bilateral screening digital breast
tomosynthesis was performed. The images were evaluated with
computer-aided detection.

[R CC synth-2D]
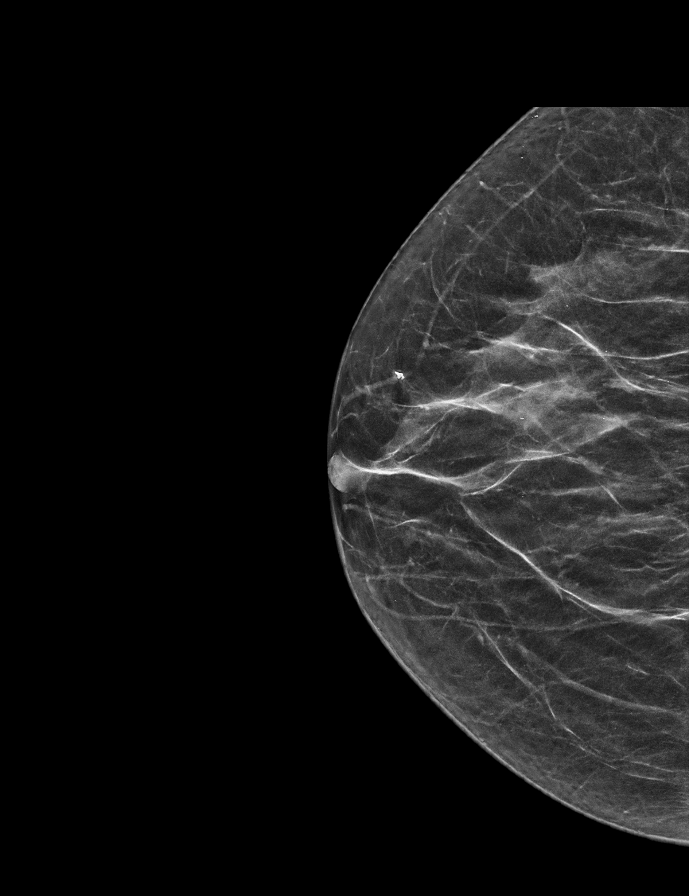

[L CC synth-2D]
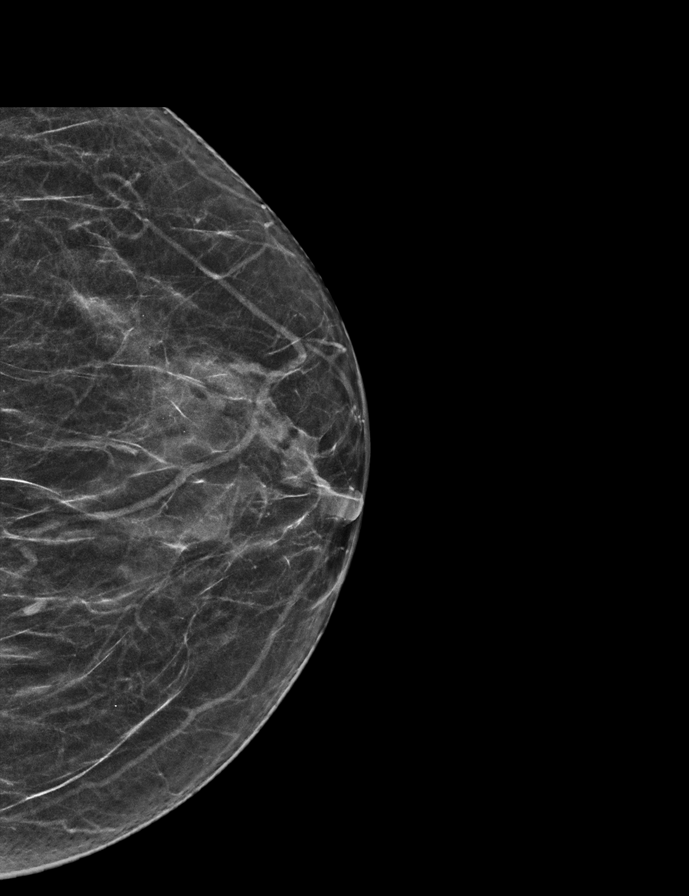

[R MLO synth-2D]
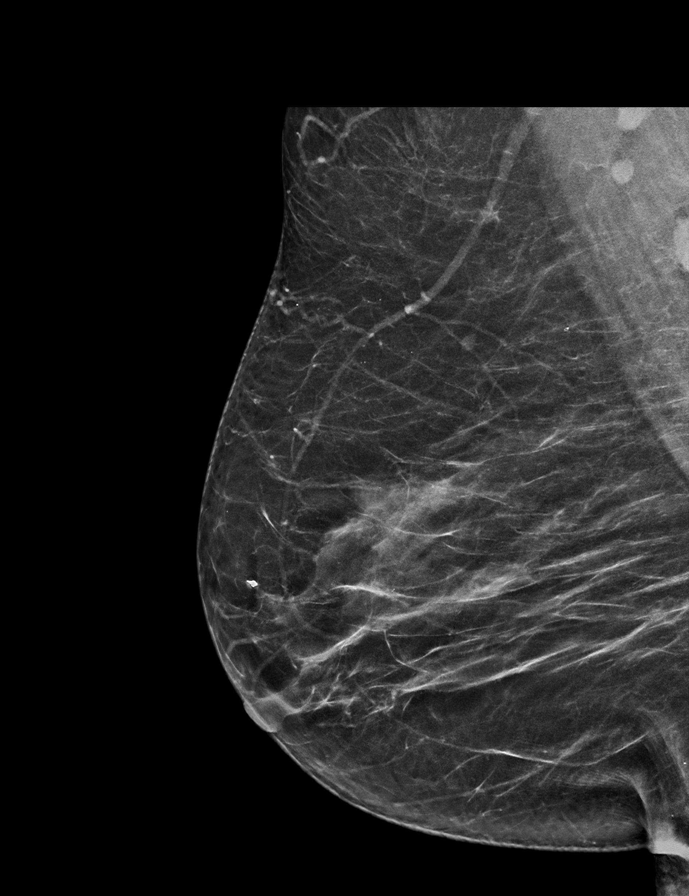

[L MLO synth-2D]
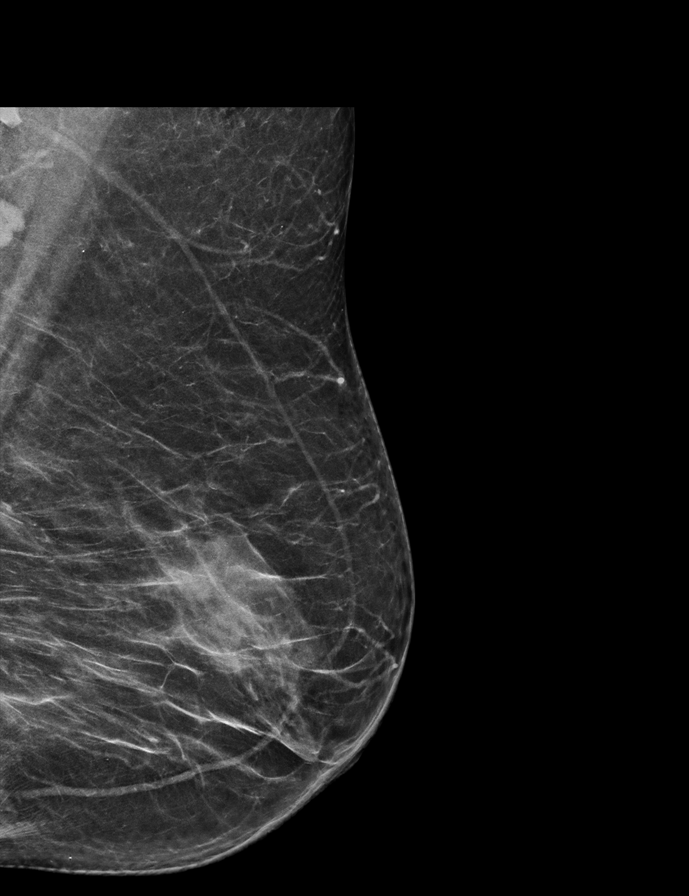

[L CC tomo · 2 of 49 frames shown]
[frame 16/49]
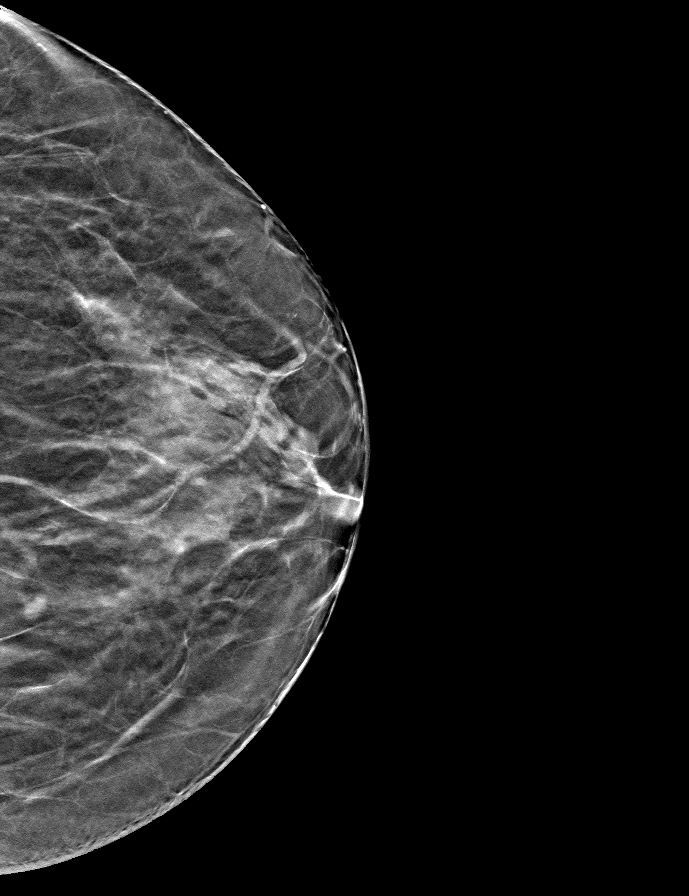
[frame 25/49]
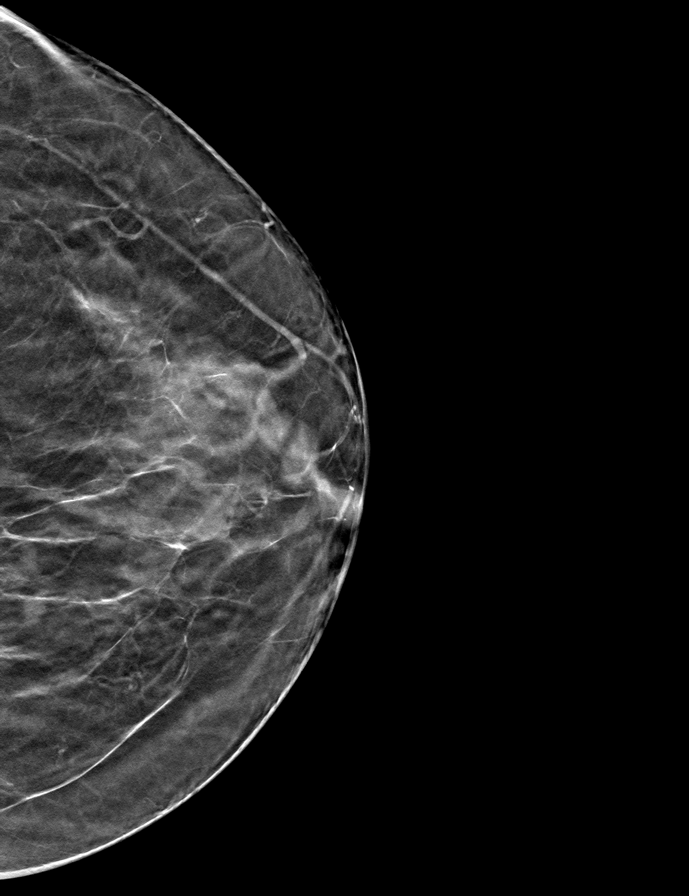

[L MLO tomo · tomo slice 32/63.0]
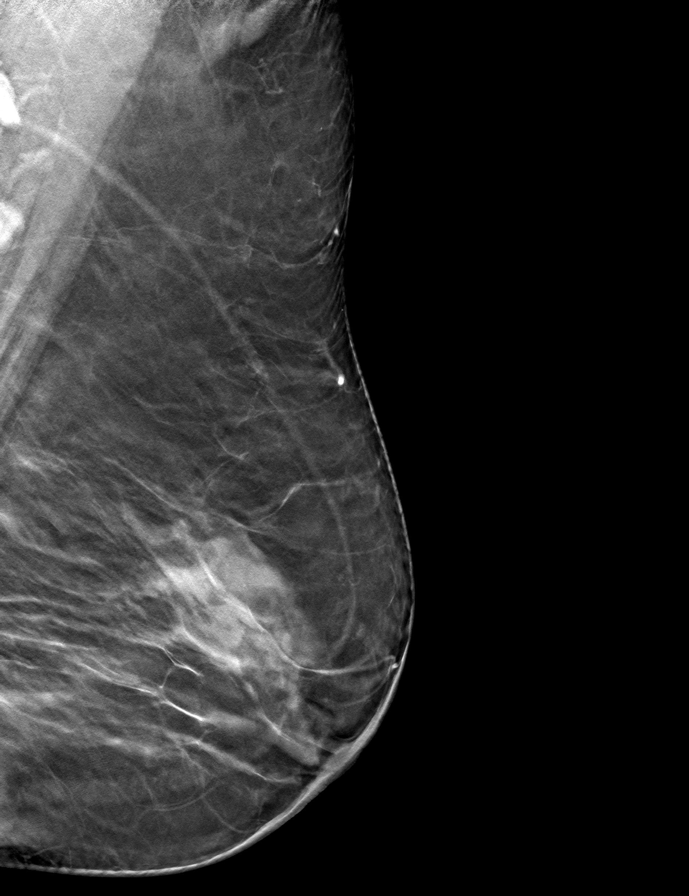

[R MLO tomo · tomo slice 31/61.0]
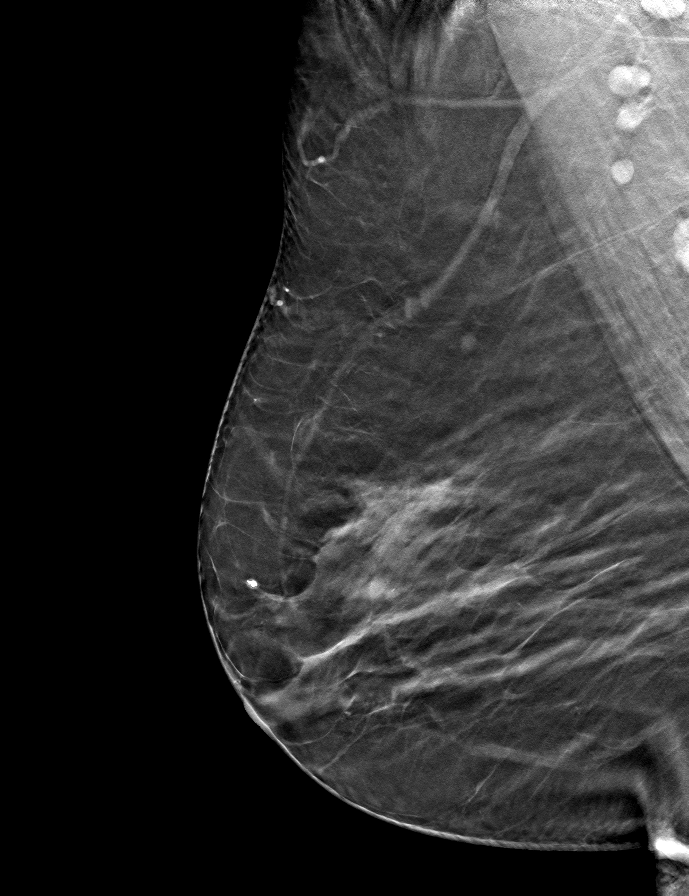

[R CC tomo · tomo slice 26/51.0]
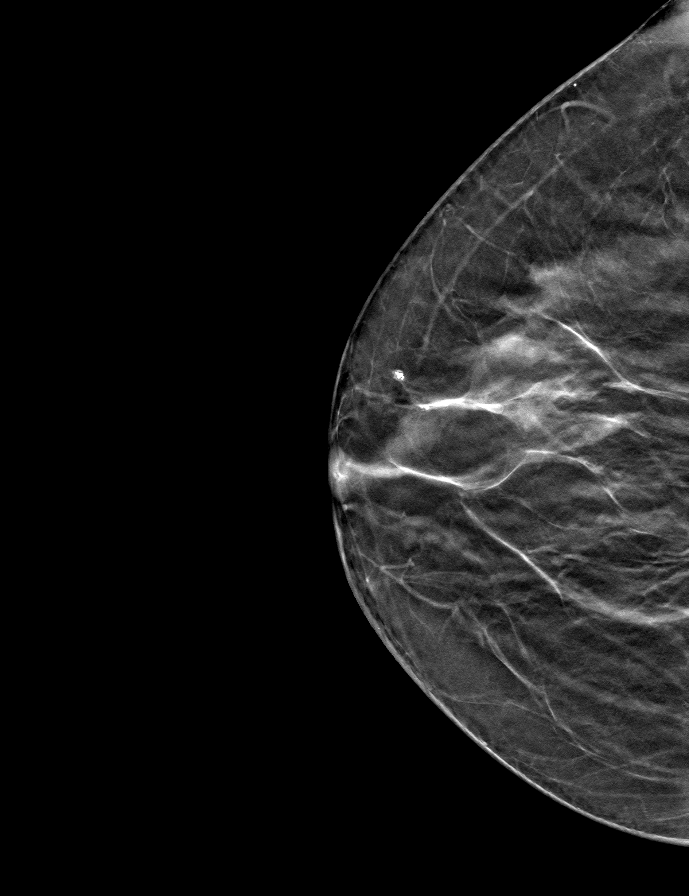

[9 of 24 positions shown; findings below may reference images not displayed]

ACR Breast Density Category b: There are scattered areas of
fibroglandular density.
FINDINGS: There are no findings suspicious for malignancy.
IMPRESSION: No mammographic evidence of malignancy. A result letter of this
screening mammogram will be mailed directly to the patient.

RECOMMENDATION:
Screening mammogram in one year. (Code:51-O-LD2)

BI-RADS CATEGORY  1: Negative.

## 2022-12-05 DIAGNOSIS — H02824 Cysts of left upper eyelid: Secondary | ICD-10-CM | POA: Diagnosis not present

## 2022-12-05 DIAGNOSIS — H2513 Age-related nuclear cataract, bilateral: Secondary | ICD-10-CM | POA: Diagnosis not present

## 2023-01-31 ENCOUNTER — Other Ambulatory Visit: Payer: Self-pay | Admitting: Family Medicine

## 2023-01-31 DIAGNOSIS — Z1231 Encounter for screening mammogram for malignant neoplasm of breast: Secondary | ICD-10-CM

## 2023-02-26 ENCOUNTER — Ambulatory Visit: Payer: Medicare HMO

## 2023-02-26 DIAGNOSIS — Z1231 Encounter for screening mammogram for malignant neoplasm of breast: Secondary | ICD-10-CM

## 2023-02-27 ENCOUNTER — Ambulatory Visit: Payer: Medicare Other

## 2023-06-27 ENCOUNTER — Ambulatory Visit: Payer: Medicare HMO

## 2023-06-27 VITALS — Ht 62.0 in | Wt 223.0 lb

## 2023-06-27 DIAGNOSIS — Z0000 Encounter for general adult medical examination without abnormal findings: Secondary | ICD-10-CM | POA: Diagnosis not present

## 2023-06-27 NOTE — Patient Instructions (Addendum)
 [name] , Thank you for taking time to come for your Medicare Wellness Visit. I appreciate your ongoing commitment to your health goals. Please review the following plan we discussed and let me know if I can assist you in the future.   Referrals/Orders/Follow-Ups/Clinician Recommendations: none  This is a list of the screening recommended for you and due dates:  Health Maintenance  Topic Date Due   COVID-19 Vaccine (1) Never done   DTaP/Tdap/Td vaccine (1 - Tdap) Never done   Zoster (Shingles) Vaccine (1 of 2) Never done   Colon Cancer Screening  Never done   Pneumonia Vaccine (1 of 1 - PCV) Never done   DEXA scan (bone density measurement)  Never done   Flu Shot  Never done   Mammogram  02/26/2024   Medicare Annual Wellness Visit  06/26/2024   Hepatitis C Screening  Completed   HPV Vaccine  Aged Out    Advanced directives: (Copy Requested) Please bring a copy of your health care power of attorney and living will to the office to be added to your chart at your convenience. You can mail to [facility] [address] [place], [place] [address] or email to [email or web address]  Next Medicare Annual Wellness Visit scheduled for next year: No Moved to practice in [address] where she lives now

## 2023-06-27 NOTE — Progress Notes (Signed)
 Subjective:   [name] is a 73 y.o. who presents for a Medicare Wellness preventive visit.  Visit Complete: Virtual I connected with  [name] on 06/27/23 by a audio enabled telemedicine application and verified that I am speaking with the correct person using two identifiers.  Patient Location: Home  Provider Location: Office/Clinic  I discussed the limitations of evaluation and management by telemedicine. The patient expressed understanding and agreed to proceed.  Vital Signs: Because this visit was a virtual/telehealth visit, some criteria may be missing or patient reported. Any vitals not documented were not able to be obtained and vitals that have been documented are patient reported.  VideoDeclined- This patient declined Librarian, academic. Therefore the visit was completed with audio only.  Persons Participating in Visit: Patient.  AWV Questionnaire: No: Patient Medicare AWV questionnaire was not completed prior to this visit.  Cardiac Risk Factors include: advanced age (>75men, >57 women);hypertension;obesity (BMI >30kg/m2)     Objective:    Today's Vitals   06/27/23 0933  Weight: 223 lb (101.2 kg)  Height: 5\' 2"  (1.575 m)   Body mass index is 40.79 kg/m.     06/27/2023    9:52 AM 05/13/2022    1:05 PM 05/09/2021    1:24 PM 08/17/2019   12:03 PM 11/27/2017    1:20 PM 02/04/2014   10:52 PM 01/26/2014    1:14 PM  Advanced Directives  Does Patient Have a Medical Advance Directive? Yes Yes Yes Yes No No No  Type of Estate agent of [place];Living will Healthcare Power of [place];Living will Healthcare Power of [place];Living will Healthcare Power of [place];Living will     Does patient want to make changes to medical advance directive?  No - Patient declined Yes (MAU/Ambulatory/Procedural Areas - Information given)      Copy of Healthcare Power of Attorney in Chart? No - copy requested No - copy requested   No - copy requested     Would patient like information on creating a medical advance directive?      No - patient declined information No - patient declined information    Current Medications (verified) Outpatient Encounter Medications as of 06/27/2023  Medication Sig   hydrochlorothiazide (HYDRODIURIL) 25 MG tablet TAKE 1 TABLET (25 MG TOTAL) BY MOUTH DAILY.   No facility-administered encounter medications on file as of 06/27/2023.    Allergies (verified) Penicillins   History: Past Medical History:  Diagnosis Date   Allergic rhinitis    Family history of diabetes mellitus    History of pulmonary embolus (PE) 11/2009   post-op LLL PE   Osteoarthritis    "both knees" (02/04/2014)   Other specified congenital anomaly of skin    Peripheral vascular disease (HCC)    Urinary frequency    Past Surgical History:  Procedure Laterality Date   JOINT REPLACEMENT     KNEE ARTHROSCOPY Bilateral X 3   "2 on one side; one on the other"   TONSILLECTOMY     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   coumadin x 6 months   TOTAL KNEE ARTHROPLASTY Right 02/04/2014   TOTAL KNEE ARTHROPLASTY Right 02/04/2014   Procedure: RIGHT TOTAL KNEE ARTHROPLASTY;  Surgeon: Verlee Rossetti, MD;  Location[facility];  Service: Orthopedics;  Laterality: Right;   TUBAL LIGATION  1983   Uterine Biopsy     RE: fibroid tumors   Family History  Problem Relation Age of Onset   Cervical cancer Mother  Diabetes type II Mother    Hiatal hernia Mother    Coronary artery disease Father        CABG   Diabetes Father    Cancer Father        bladder   Arrhythmia Father    Gout Father    Osteoarthritis Paternal Grandmother    Cancer Paternal Grandfather    Diabetes Paternal Grandfather    Parkinsonism Maternal Grandmother    Other Maternal Grandfather        "fluid in his lungs"   Breast cancer Neg Hx    Social History   Socioeconomic History   Marital status: Married    Spouse name: Not on file   Number of  children: Not on file   Years of education: Not on file   Highest education level: Not on file  Occupational History   Not on file  Tobacco Use   Smoking status: Never   Smokeless tobacco: Never  Substance and Sexual Activity   Alcohol use: Yes    Alcohol/week: 0.0 standard drinks of alcohol    Comment: 02/04/2014 "maybe once/month I'll have a mixed drink or ffruit lavored drink"   Drug use: No   Sexual activity: Yes  Other Topics Concern   Not on file  Social History Narrative   Not on file   Social Drivers of Health   Financial Resource Strain: Low Risk  (06/27/2023)   Overall Financial Resource Strain (CARDIA)    Difficulty of Paying Living Expenses: Not hard at all  Food Insecurity: No Food Insecurity (06/27/2023)   Hunger Vital Sign    Worried About Running Out of Food in the Last Year: Never true    Ran Out of Food in the Last Year: Never true  Transportation Needs: No Transportation Needs (06/27/2023)   PRAPARE - Administrator, Civil Service (Medical): No    Lack of Transportation (Non-Medical): No  Physical Activity: Sufficiently Active (06/27/2023)   Exercise Vital Sign    Days of Exercise per Week: 7 days    Minutes of Exercise per Session: 30 min  Stress: No Stress Concern Present (06/27/2023)   [employer] of Occupational Health - Occupational Stress Questionnaire    Feeling of Stress : Not at all  Social Connections: Moderately Integrated (06/27/2023)   Social Connection and Isolation Panel [NHANES]    Frequency of Communication with Friends and Family: More than three times a week    Frequency of Social Gatherings with Friends and Family: Twice a week    Attends Religious Services: 1 to 4 times per year    Active Member of [employer] or Organizations: No    Attends Engineer, structural: Never    Marital Status: Married    Tobacco Counseling Counseling given: Not Answered    Clinical Intake:  Pre-visit preparation completed:  Yes  Pain : No/denies pain    BMI - recorded: 40.79 Nutritional Status: BMI > 30  Obese Nutritional Risks: None Diabetes: No  How often do you need to have someone help you when you read instructions, pamphlets, or other written materials from your doctor or pharmacy?: 1 - Never  Interpreter Needed?: No  Comments: lives with husband Information entered by :: B.Niva Murren,LPN   Activities of Daily Living     06/27/2023    9:52 AM 05/12/2023   10:40 AM  In your present state of health, do you have any difficulty performing the following activities:  Hearing? 0 0  Vision?  0 0  Difficulty concentrating or making decisions? 0 0  Walking or climbing stairs? 1 0  Dressing or bathing? 0 0  Doing errands, shopping? 0 0  Preparing Food and eating ? N N  Using the Toilet? N N  In the past six months, have you accidently leaked urine? N N  Do you have problems with loss of bowel control? N N  Managing your Medications? N N  Managing your Finances? N N  Housekeeping or managing your Housekeeping? N N    Patient Care Team: Tower, Audrie Gallus, MD as PCP - General  Indicate any recent Medical Services you may have received from other than [facility] providers in the past year (date may be approximate).     Assessment:   This is a routine wellness examination for [name].  Hearing/Vision screen Hearing Screening - Comments:: Pt says her hearing is good with hearing aids Vision Screening - Comments:: Pt says her vision is good;readers only [facility]   Goals Addressed             This Visit's Progress    Patient Stated   Not on track    06/27/23-I will continue to walk on a daily a basis for about 1 hour.     Patient Stated   On track    06/27/23-Would like to maintain a current routine        Depression Screen     06/27/2023    9:41 AM 05/13/2022    1:19 PM 05/09/2021    1:27 PM 09/26/2020    2:14 PM 08/17/2019   12:05 PM 11/28/2017    3:47 PM  PHQ 2/9 Scores  PHQ - 2 Score 0 0 0 0  0 0  PHQ- 9 Score    0 0     Fall Risk     06/27/2023    9:38 AM 05/12/2023   10:40 AM 05/13/2022    1:11 PM 05/10/2022    7:57 AM 05/09/2021    1:25 PM  Fall Risk   Falls in the past year? 0 1 1 1 1   Number falls in past yr: 0 0 0 0 0  Injury with Fall? 0 0  0 0  Risk for fall due to : No Fall Risks    No Fall Risks  Follow up Education provided;Falls prevention discussed  Falls evaluation completed;Falls prevention discussed  Falls prevention discussed    MEDICARE RISK AT HOME:  Medicare Risk at Home Any stairs in or around the home?: No If so, are there any without handrails?: No Home free of loose throw rugs in walkways, pet beds, electrical cords, etc?: Yes Adequate lighting in your home to reduce risk of falls?: Yes Life alert?: No Use of a cane, walker or w/c?: No Grab bars in the bathroom?: Yes Shower chair or bench in shower?: No Elevated toilet seat or a handicapped toilet?: Yes  TIMED UP AND GO:  Was the test performed?  No  Cognitive Function: 6CIT completed    08/17/2019   12:06 PM  MMSE - Mini Mental State Exam  Orientation to time 5  Orientation to Place 5  Registration 3  Attention/ Calculation 5  Recall 3  Language- repeat 1        06/27/2023    9:56 AM 05/13/2022    1:19 PM  6CIT Screen  What Year? 0 points 0 points  What month? 0 points 0 points  What time? 0 points 0 points  Count back from 20 0 points 0 points  Months in reverse 0 points 0 points  Repeat phrase 0 points 0 points  Total Score 0 points 0 points    Immunizations  There is no immunization history on file for this patient.  Screening Tests Health Maintenance  Topic Date Due   COVID-19 Vaccine (1) Never done   DTaP/Tdap/Td (1 - Tdap) Never done   Zoster Vaccines- Shingrix (1 of 2) Never done   Colonoscopy  Never done   Pneumonia Vaccine 57+ Years old (1 of 1 - PCV) Never done   DEXA SCAN  Never done   INFLUENZA VACCINE  Never done   MAMMOGRAM  02/26/2024   Medicare  Annual Wellness (AWV)  06/26/2024   Hepatitis C Screening  Completed   HPV VACCINES  Aged Out    Health Maintenance  Health Maintenance Due  Topic Date Due   COVID-19 Vaccine (1) Never done   DTaP/Tdap/Td (1 - Tdap) Never done   Zoster Vaccines- Shingrix (1 of 2) Never done   Colonoscopy  Never done   Pneumonia Vaccine 26+ Years old (1 of 1 - PCV) Never done   DEXA SCAN  Never done   INFLUENZA VACCINE  Never done   Health Maintenance Items Addressed: Vaccinations: declines all Influenza vaccine: recommend every Fall Pneumococcal vaccine: recommend once per lifetime Prevnar-20 Tdap vaccine: recommend every 10 years Shingles vaccine: recommend Shingrix which is 2 doses 2-6 months apart and over 90% effective     Covid-19: recommend 2 doses one month apart with a booster 6 months later  Colorectal Cancer Screening: Patient declined colorectal cancer screening  Bone Density Status:Patient declined bone density screening   Additional Screening:  Vision Screening: Recommended annual ophthalmology exams for early detection of glaucoma and other disorders of the eye.  Dental Screening: Recommended annual dental exams for proper oral hygiene  Community Resource Referral / Chronic Care Management: CRR required this visit?  No   CCM required this visit?  Appt scheduled with PCP    Plan:     I have personally reviewed and noted the following in the patient's chart:   Medical and social history Use of alcohol, tobacco or illicit drugs  Current medications and supplements including opioid prescriptions. Patient is not currently taking opioid prescriptions. Functional ability and status Nutritional status Physical activity Advanced directives List of other physicians Hospitalizations, surgeries, and ER visits in previous 12 months Vitals Screenings to include cognitive, depression, and falls Referrals and appointments  In addition, I have reviewed and discussed with patient  certain preventive protocols, quality metrics, and best practice recommendations. A written personalized care plan for preventive services as well as general preventive health recommendations were provided to patient.    Sue Lush, LPN   1/61/0960   After Visit Summary: (Declined) Due to this being a telephonic visit, with patients personalized plan was offered to patient but patient Declined AVS at this time   Notes: Nothing significant to report at this time.

## 2023-09-23 ENCOUNTER — Encounter: Payer: Self-pay | Admitting: Family Medicine

## 2023-09-23 ENCOUNTER — Ambulatory Visit (INDEPENDENT_AMBULATORY_CARE_PROVIDER_SITE_OTHER): Admitting: Family Medicine

## 2023-09-23 ENCOUNTER — Ambulatory Visit: Payer: Medicare HMO | Admitting: Family Medicine

## 2023-09-23 VITALS — BP 136/84 | HR 77 | Temp 97.6°F | Resp 18 | Ht 62.0 in | Wt 230.2 lb

## 2023-09-23 DIAGNOSIS — Z1211 Encounter for screening for malignant neoplasm of colon: Secondary | ICD-10-CM | POA: Diagnosis not present

## 2023-09-23 DIAGNOSIS — R7303 Prediabetes: Secondary | ICD-10-CM | POA: Diagnosis not present

## 2023-09-23 DIAGNOSIS — Z13228 Encounter for screening for other metabolic disorders: Secondary | ICD-10-CM | POA: Diagnosis not present

## 2023-09-23 DIAGNOSIS — I10 Essential (primary) hypertension: Secondary | ICD-10-CM

## 2023-09-23 MED ORDER — HYDROCHLOROTHIAZIDE 25 MG PO TABS: 25.0000 mg | ORAL_TABLET | Freq: Every day | ORAL | 1 refills | Status: DC

## 2023-09-23 NOTE — Patient Instructions (Signed)
 Healthy Heart:  Recommend heart healthy/Mediterranean diet, with whole grains, fruits, vegetable, fish, lean meats, nuts, and olive oil. Limit salt. Recommend moderate walking, 3-5 times/week for 30-50 minutes each session. Aim for at least 150 minutes.week. Goal should be pace of 3 miles/hours, or walking 1.5 miles in 30 minutes. Resistance training is recommended.  Recommend avoidance of tobacco products. Avoid excess alcohol.

## 2023-09-23 NOTE — Assessment & Plan Note (Addendum)
 Prescribed hydrochlorothiazide  25 mg by previous provider.  Has been out since the fall. Blood pressure near goal. Denies chest pain, shortness of breath, vision changes, and headaches.  Restart hydrochlorothiazide  25 mg daily for blood pressure control and fluid retention. CMP today. Follow-up in 4 weeks for blood pressure check and to check potassium levels. If stable, follow-up every 6 months. Refill sent.

## 2023-09-23 NOTE — Assessment & Plan Note (Signed)
 Last A1C in EMR 5.9 in 2022.  Recheck today. Not currently taking medication. Working on getting diet and exercise back on track.

## 2023-09-23 NOTE — Progress Notes (Signed)
 New Patient Office Visit  Subjective    Patient ID: [name], female    DOB: [birth year]  Age: 73 y.o. MRN: [id number]  CC:  Chief Complaint  Patient presents with   New Patient (Initial Visit)    Est. Care no questions or concerns pt hasn't had a physical in almost 27yrs    HPI [name] presents to establish care with this practice. She is new to me.   HTN: Prescribed hydrochlorothiazide  25 mg daily for elevated blood pressure and edema. Has been out since last fall. Had one left.  Took one yesterday and feels better today. She urinated a lot yesterday.  Walking twice per day for 30 minutes. Trying to lose weight again. Stays busy.   Chart review: 09/2020 A1C 5.9   Outpatient Encounter Medications as of 09/23/2023  Medication Sig   [DISCONTINUED] hydrochlorothiazide  (HYDRODIURIL ) 25 MG tablet TAKE 1 TABLET (25 MG TOTAL) BY MOUTH DAILY.   hydrochlorothiazide  (HYDRODIURIL ) 25 MG tablet Take 1 tablet (25 mg total) by mouth daily.   No facility-administered encounter medications on file as of 09/23/2023.    Past Medical History:  Diagnosis Date   Allergic rhinitis    Allergy    Pollen   Cataract 2023   Begining   Family history of diabetes mellitus    History of pulmonary embolus (PE) 11/13/2009   post-op LLL PE   Hypertension 2020   Osteoarthritis    "both knees" (02/04/2014)   Other specified congenital anomaly of skin    Peripheral vascular disease (HCC)    Urinary frequency     Past Surgical History:  Procedure Laterality Date   JOINT REPLACEMENT     KNEE ARTHROSCOPY Bilateral X 3   "2 on one side; one on the other"   TONSILLECTOMY     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   coumadin  x 6 months   TOTAL KNEE ARTHROPLASTY Right 02/04/2014   TOTAL KNEE ARTHROPLASTY Right 02/04/2014   Procedure: RIGHT TOTAL KNEE ARTHROPLASTY;  Surgeon: Lorriane Rote, MD;  Location: [facility] OR;  Service: Orthopedics;  Laterality: Right;   TUBAL LIGATION  1983   Uterine  Biopsy     RE: fibroid tumors    Family History  Problem Relation Age of Onset   Cervical cancer Mother    Diabetes type II Mother    Hiatal hernia Mother    Cancer Mother    Coronary artery disease Father        CABG   Diabetes Father    Cancer Father        bladder   Arrhythmia Father    Gout Father    Arthritis Father    COPD Father    Hearing loss Father    Heart disease Father    Osteoarthritis Paternal Grandmother    Cancer Paternal Grandfather    Diabetes Paternal Grandfather    Parkinsonism Maternal Grandmother    Other Maternal Grandfather        "fluid in his lungs"   Breast cancer Neg Hx     Social History   Socioeconomic History   Marital status: Married    Spouse name: Not on file   Number of children: Not on file   Years of education: Not on file   Highest education level: 12th grade  Occupational History   Not on file  Tobacco Use   Smoking status: Never   Smokeless tobacco: Never  Substance and Sexual Activity   Alcohol use:  Not Currently    Comment: 02/04/2014 "maybe once/month I'll have a mixed drink or ffruit lavored drink"   Drug use: No   Sexual activity: Yes    Birth control/protection: None  Other Topics Concern   Not on file  Social History Narrative   Not on file   Social Drivers of Health   Financial Resource Strain: Low Risk  (09/22/2023)   Overall Financial Resource Strain (CARDIA)    Difficulty of Paying Living Expenses: Not hard at all  Food Insecurity: No Food Insecurity (09/22/2023)   Hunger Vital Sign    Worried About Running Out of Food in the Last Year: Never true    Ran Out of Food in the Last Year: Never true  Transportation Needs: No Transportation Needs (09/22/2023)   PRAPARE - Administrator, Civil Service (Medical): No    Lack of Transportation (Non-Medical): No  Physical Activity: Sufficiently Active (09/22/2023)   Exercise Vital Sign    Days of Exercise per Week: 7 days    Minutes of Exercise per  Session: 30 min  Stress: No Stress Concern Present (09/22/2023)   [employer] of Occupational Health - Occupational Stress Questionnaire    Feeling of Stress : Only a little  Social Connections: Moderately Integrated (09/22/2023)   Social Connection and Isolation Panel [NHANES]    Frequency of Communication with Friends and Family: More than three times a week    Frequency of Social Gatherings with Friends and Family: Three times a week    Attends Religious Services: 1 to 4 times per year    Active Member of Clubs or Organizations: No    Attends Banker Meetings: Never    Marital Status: Married  Catering manager Violence: Not At Risk (06/27/2023)   Humiliation, Afraid, Rape, and Kick questionnaire    Fear of Current or Ex-Partner: No    Emotionally Abused: No    Physically Abused: No    Sexually Abused: No    Review of Systems  Eyes:  Negative for blurred vision and double vision.  Respiratory:  Negative for shortness of breath.   Cardiovascular:  Negative for chest pain.  Neurological:  Negative for headaches.  Psychiatric/Behavioral:  Negative for depression. The patient is not nervous/anxious.         Objective    BP 136/84 (BP Location: Right Arm, Patient Position: Sitting, Cuff Size: Large)   Pulse 77   Temp 97.6 F (36.4 C) (Oral)   Resp 18   Ht 5\' 2"  (1.575 m)   Wt 230 lb 4 oz (104.4 kg)   SpO2 97%   BMI 42.11 kg/m   Physical Exam Vitals and nursing note reviewed.  Constitutional:      Appearance: Normal appearance. She is obese.  Cardiovascular:     Rate and Rhythm: Normal rate and regular rhythm.     Heart sounds: Normal heart sounds.  Pulmonary:     Effort: Pulmonary effort is normal.     Breath sounds: Normal breath sounds.  Skin:    General: Skin is warm and dry.  Neurological:     General: No focal deficit present.     Mental Status: She is alert. Mental status is at baseline.  Psychiatric:        Mood and Affect: Mood normal.         Behavior: Behavior normal.        Thought Content: Thought content normal.        Judgment: Judgment  normal.       Flowsheet Row Office Visit from 09/23/2023 in [facility]  PHQ-9 Total Score 0         09/23/2023    1:17 PM  GAD 7 : Generalized Anxiety Score  Nervous, Anxious, on Edge 0  Control/stop worrying 0  Worry too much - different things 3  Trouble relaxing 0  Restless 0  Easily annoyed or irritable 0  Afraid - awful might happen 0  Total GAD 7 Score 3  Anxiety Difficulty Not difficult at all      Assessment & Plan:   Essential hypertension Assessment & Plan: Prescribed hydrochlorothiazide  25 mg by previous provider.  Has been out since the fall. Blood pressure near goal. Denies chest pain, shortness of breath, vision changes, and headaches.  Restart hydrochlorothiazide  25 mg daily for blood pressure control and fluid retention. CMP today. Follow-up in 4 weeks for blood pressure check and to check potassium levels. If stable, follow-up every 6 months. Refill sent.   Orders: -     hydroCHLOROthiazide ; Take 1 tablet (25 mg total) by mouth daily.  Dispense: 90 tablet; Refill: 1 -     Comprehensive metabolic panel with GFR  Screening for colon cancer -     Cologuard  Prediabetes Assessment & Plan: Last A1C in EMR 5.9 in 2022.  Recheck today. Not currently taking medication. Working on getting diet and exercise back on track.   Orders: -     Hemoglobin A1c  Encounter for screening for metabolic disorder    Agrees with plan of care discussed.  Questions answered.   Return in about 4 weeks (around 10/21/2023) for HTN and BMP .   Mickiel Albany, FNP

## 2023-09-24 LAB — COMPREHENSIVE METABOLIC PANEL WITH GFR
ALT: 10 IU/L (ref 0–32)
AST: 13 IU/L (ref 0–40)
Albumin: 4.5 g/dL (ref 3.8–4.8)
Alkaline Phosphatase: 138 IU/L — ABNORMAL HIGH (ref 44–121)
BUN/Creatinine Ratio: 22 (ref 12–28)
BUN: 18 mg/dL (ref 8–27)
Bilirubin Total: 0.9 mg/dL (ref 0.0–1.2)
CO2: 25 mmol/L (ref 20–29)
Calcium: 9.9 mg/dL (ref 8.7–10.3)
Chloride: 101 mmol/L (ref 96–106)
Creatinine, Ser: 0.82 mg/dL (ref 0.57–1.00)
Globulin, Total: 2.7 g/dL (ref 1.5–4.5)
Glucose: 93 mg/dL (ref 70–99)
Potassium: 5.4 mmol/L — ABNORMAL HIGH (ref 3.5–5.2)
Sodium: 141 mmol/L (ref 134–144)
Total Protein: 7.2 g/dL (ref 6.0–8.5)
eGFR: 75 mL/min/{1.73_m2} (ref >59)

## 2023-09-24 LAB — HEMOGLOBIN A1C
Est. average glucose Bld gHb Est-mCnc: 114 mg/dL
Hgb A1c MFr Bld: 5.6 % (ref 4.8–5.6)

## 2023-09-25 ENCOUNTER — Ambulatory Visit: Payer: Self-pay | Admitting: Family Medicine

## 2023-09-30 DIAGNOSIS — Z1211 Encounter for screening for malignant neoplasm of colon: Secondary | ICD-10-CM | POA: Diagnosis not present

## 2023-10-04 LAB — COLOGUARD: COLOGUARD: NEGATIVE

## 2023-10-21 ENCOUNTER — Ambulatory Visit (INDEPENDENT_AMBULATORY_CARE_PROVIDER_SITE_OTHER): Admitting: Family Medicine

## 2023-10-21 ENCOUNTER — Encounter: Payer: Self-pay | Admitting: Family Medicine

## 2023-10-21 VITALS — BP 114/83 | HR 89 | Temp 98.1°F | Ht 63.0 in | Wt 227.0 lb

## 2023-10-21 DIAGNOSIS — I10 Essential (primary) hypertension: Secondary | ICD-10-CM | POA: Diagnosis not present

## 2023-10-21 NOTE — Progress Notes (Deleted)
   Established Patient Office Visit  Subjective   Patient ID: [name], female    DOB: 10-Dec-1950  Age: 72 y.o. MRN: [id number]  No chief complaint on file.   HPI   Hypertension Medication compliance: Taking hydrochlorothiazide  25 mg daily as prescribed.  Denies chest pain, shortness of breath, lower extremity edema, vision changes, headaches. *** Pertinent lab work: *** Monitoring at home: *** Tolerating medication well: *** Continue current medication regimen: *** Follow-up: ***   ROS    Objective:     There were no vitals taken for this visit. {Vitals History (Optional):23777}  Physical Exam   No results found for any visits on 10/21/23.  {Labs (Optional):23779}  The ASCVD Risk score (Arnett DK, et al., 2019) failed to calculate for the following reasons:   Cannot find a previous HDL lab   Cannot find a previous total cholesterol lab    Assessment & Plan:   Problem List Items Addressed This Visit     Essential hypertension - Primary    No follow-ups on file.    Kara JONELLE Brownie, FNP

## 2023-10-21 NOTE — Progress Notes (Signed)
   Established Patient Office Visit  Subjective   Patient ID: [name], female    DOB: [birth year]  Age: 73 y.o. MRN: [id number]  Chief Complaint  Patient presents with   Hypertension    Follow-up     HPI  Hypertension Medication compliance: Taking hydrochlorothiazide  25 mg daily as prescribed.  Denies chest pain, shortness of breath, lower extremity edema, vision changes, headaches.  Pertinent lab work: recheck BMP today Monitoring at home: does not monitoring at home.  Tolerating medication well: no side effects reported  Continue current medication regimen: no changes  Follow-up: 6 months    Review of Systems  Eyes:  Negative for blurred vision and double vision.  Respiratory:  Negative for shortness of breath.   Cardiovascular:  Negative for chest pain and leg swelling.  Neurological:  Negative for headaches.      Objective:     BP 114/83 (BP Location: Left Arm, Patient Position: Sitting)   Pulse 89   Temp 98.1 F (36.7 C) (Oral)   Ht 5' 3 (1.6 m)   Wt 227 lb (103 kg)   SpO2 96%   BMI 40.21 kg/m    Physical Exam Vitals and nursing note reviewed.  Constitutional:      General: She is not in acute distress.    Appearance: Normal appearance.  Cardiovascular:     Rate and Rhythm: Normal rate and regular rhythm.     Heart sounds: Normal heart sounds.  Pulmonary:     Effort: Pulmonary effort is normal.     Breath sounds: Normal breath sounds.  Skin:    General: Skin is warm and dry.  Neurological:     General: No focal deficit present.     Mental Status: She is alert. Mental status is at baseline.  Psychiatric:        Mood and Affect: Mood normal.        Behavior: Behavior normal.        Thought Content: Thought content normal.        Judgment: Judgment normal.      No results found for any visits on 10/21/23.    The ASCVD Risk score (Arnett DK, et al., 2019) failed to calculate for the following reasons:   Cannot find a previous HDL lab    Cannot find a previous total cholesterol lab    Assessment & Plan:   Problem List Items Addressed This Visit     Essential hypertension - Primary    Taking hydrochlorothiazide  25 mg daily as prescribed.  Denies chest pain, shortness of breath, lower extremity edema, vision changes, headaches.  BMP today Well controlled in office. Refills sent at last visit. Follow-up in 5 months, sooner if anything changes.        Relevant Orders   Basic metabolic panel with GFR  Agrees with plan of care discussed.  Questions answered.   Return in about 5 months (around 03/22/2024) for HTN.    Darice JONELLE Brownie, FNP

## 2023-10-21 NOTE — Assessment & Plan Note (Signed)
 Taking hydrochlorothiazide  25 mg daily as prescribed.  Denies chest pain, shortness of breath, lower extremity edema, vision changes, headaches.  BMP today Well controlled in office. Refills sent at last visit. Follow-up in 5 months, sooner if anything changes.

## 2023-10-22 ENCOUNTER — Ambulatory Visit: Payer: Self-pay | Admitting: Family Medicine

## 2023-10-22 LAB — BASIC METABOLIC PANEL WITH GFR
BUN/Creatinine Ratio: 19 (ref 12–28)
BUN: 16 mg/dL (ref 8–27)
CO2: 25 mmol/L (ref 20–29)
Calcium: 9.7 mg/dL (ref 8.7–10.3)
Chloride: 103 mmol/L (ref 96–106)
Creatinine, Ser: 0.84 mg/dL (ref 0.57–1.00)
Glucose: 87 mg/dL (ref 70–99)
Potassium: 4.9 mmol/L (ref 3.5–5.2)
Sodium: 140 mmol/L (ref 134–144)
eGFR: 72 mL/min/1.73 (ref >59)

## 2023-12-08 DIAGNOSIS — H903 Sensorineural hearing loss, bilateral: Secondary | ICD-10-CM | POA: Diagnosis not present

## 2023-12-09 DIAGNOSIS — H2513 Age-related nuclear cataract, bilateral: Secondary | ICD-10-CM | POA: Diagnosis not present

## 2023-12-09 DIAGNOSIS — H02824 Cysts of left upper eyelid: Secondary | ICD-10-CM | POA: Diagnosis not present

## 2024-01-26 ENCOUNTER — Other Ambulatory Visit: Payer: Self-pay | Admitting: Family Medicine

## 2024-01-26 DIAGNOSIS — Z1231 Encounter for screening mammogram for malignant neoplasm of breast: Secondary | ICD-10-CM

## 2024-01-29 ENCOUNTER — Ambulatory Visit: Payer: Self-pay

## 2024-01-29 NOTE — Telephone Encounter (Signed)
 FYI Only or Action Required?: FYI only for provider.  Patient was last seen in primary care on 10/21/2023 by Booker Darice SAUNDERS, FNP.  Called Nurse Triage reporting Wrist Pain.  Symptoms began several weeks ago.  Interventions attempted: OTC medications: aleve.  Symptoms are: gradually worsening.  Triage Disposition: See PCP When Office is Open (Within 3 Days)  Patient/caregiver understands and will follow disposition?: Yes          Copied from CRM #[id number]. Topic: Clinical - Red Word Triage >> Jan 29, 2024  9:15 AM Antwanette L wrote: Red Word that prompted transfer to Nurse Triage: The patient has been experiencing persistent ache and swelling in the right wrist for several weeks Reason for Disposition  Swollen joint of new-onset  Answer Assessment - Initial Assessment Questions 1. ONSET: When did the pain start?     3 weeks 2. LOCATION: Where is the pain located?     R wrist 3. PAIN: How bad is the pain? (Scale 1-10; or mild, moderate, severe)     Endorses aching mainly at night 4. WORK OR EXERCISE: Has there been any recent work or exercise that involved this part (i.e., hand or wrist) of the body?     denies 5. CAUSE: What do you think is causing the pain?     unknown 6. AGGRAVATING FACTORS: What makes the pain worse? (e.g., using computer)     Not moving wrist at night. 7. OTHER SYMPTOMS: Do you have any other symptoms? (e.g., fever, neck pain, numbness or tingling, rash, swelling)     Stiff fingers, aching at night that will awaken pt. Slight swelling where wrist bone is. 8. PREGNANCY: Is there any chance you are pregnant? When was your last menstrual period?     N/a  Protocols used: Wrist Pain-A-AH

## 2024-01-30 ENCOUNTER — Encounter: Payer: Self-pay | Admitting: Urgent Care

## 2024-01-30 ENCOUNTER — Ambulatory Visit: Admitting: Urgent Care

## 2024-01-30 VITALS — BP 147/76 | HR 69 | Ht 63.0 in | Wt 235.0 lb

## 2024-01-30 DIAGNOSIS — M778 Other enthesopathies, not elsewhere classified: Secondary | ICD-10-CM

## 2024-01-30 MED ORDER — DICLOFENAC SODIUM 75 MG PO TBEC: 75.0000 mg | DELAYED_RELEASE_TABLET | Freq: Two times a day (BID) | ORAL | 0 refills | Status: DC

## 2024-01-30 NOTE — Patient Instructions (Signed)
 Wear the wrist brace at all times for a minimum of two weeks. Only take off to shower.  Take the diclofenac twice daily with food to prevent upset stomach.  After two weeks, perform the exercises attached.  If no improvement in 2 weeks, return for imaging

## 2024-01-30 NOTE — Progress Notes (Signed)
 Established Patient Office Visit  Subjective:  Patient ID: [name], female    DOB: 03-30-1951  Age: 73 y.o. MRN: [id number]  Chief Complaint  Patient presents with   Wrist Pain    Right; achy/sore x weeks with swelling    Wrist Pain      Discussed the use of AI scribe software for clinical note transcription with the patient, who gave verbal consent to proceed.  History of Present Illness   [name] is a 73 year old female with arthritis who presents with right wrist pain and swelling.  She has been experiencing right wrist pain for the past three weeks, initially without swelling. In the last three days, swelling has developed and persisted. The pain is localized to the right side of the wrist, particularly over the bone, and is described as aching, especially at night, sometimes waking her up.  She has been taking Aleve at night, which allows her to sleep through most of the night. The pain occasionally radiates up the arm, particularly when the wrist is moved. No recent trauma, falls, or injuries to the wrist.  Her past medical history includes arthritis, for which she has undergone two knee replacements. She is right-handed and engages in various activities using her right hand, including house cleaning, laundry, and outdoor work such as trimming bushes.  No history of stomach issues or heartburn. No black and blue discoloration of the wrist.      Patient Active Problem List   Diagnosis Date Noted   Prediabetes 09/23/2023   Hearing loss 10/27/2019   Shingles 05/24/2019   Encounter for screening for metabolic disorder 04/21/2019   Elevated random blood glucose level 12/16/2017   Subclinical hypothyroidism 12/16/2017   Essential hypertension 11/28/2017   Localized deposits of fat 11/30/2014   Obesity 11/30/2014   Arthritis of knee, right 02/04/2014   History of anemia 12/06/2013   History of pulmonary embolism 01/02/2010   Past Medical History:  Diagnosis  Date   Allergic rhinitis    Allergy    Pollen   Cataract 2023   Begining   Family history of diabetes mellitus    History of pulmonary embolus (PE) 11/13/2009   post-op LLL PE   Hypertension 2020   Osteoarthritis    both knees (02/04/2014)   Other specified congenital anomaly of skin    Peripheral vascular disease    Urinary frequency    Past Surgical History:  Procedure Laterality Date   JOINT REPLACEMENT     KNEE ARTHROSCOPY Bilateral X 3   2 on one side; one on the other   TONSILLECTOMY     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   coumadin  x 6 months   TOTAL KNEE ARTHROPLASTY Right 02/04/2014   TOTAL KNEE ARTHROPLASTY Right 02/04/2014   Procedure: RIGHT TOTAL KNEE ARTHROPLASTY;  Surgeon: Elspeth JONELLE Her, MD;  Location: [facility] OR;  Service: Orthopedics;  Laterality: Right;   TUBAL LIGATION  1983   Uterine Biopsy     RE: fibroid tumors   Social History   Tobacco Use   Smoking status: Never   Smokeless tobacco: Never  Substance Use Topics   Alcohol use: Not Currently    Comment: 02/04/2014 maybe once/month I'll have a mixed drink or ffruit lavored drink   Drug use: No      ROS: as noted in HPI  Objective:     BP (!) 147/76   Pulse 69   Ht 5' 3 (1.6 m)  Wt 235 lb (106.6 kg)   SpO2 95%   BMI 41.63 kg/m  BP Readings from Last 3 Encounters:  01/30/24 (!) 147/76  10/21/23 114/83  09/23/23 136/84   Wt Readings from Last 3 Encounters:  01/30/24 235 lb (106.6 kg)  10/21/23 227 lb (103 kg)  09/23/23 230 lb 4 oz (104.4 kg)      Physical Exam Vitals and nursing note reviewed. Exam conducted with a chaperone present.  Constitutional:      General: She is not in acute distress.    Appearance: Normal appearance. She is obese. She is not ill-appearing, toxic-appearing or diaphoretic.  HENT:     Head: Normocephalic.  Cardiovascular:     Rate and Rhythm: Normal rate.     Pulses: Normal pulses.  Pulmonary:     Effort: Pulmonary effort is normal. No  respiratory distress.  Musculoskeletal:        General: Tenderness present. No swelling or signs of injury. Normal range of motion.     Right elbow: Normal.     Left elbow: Normal.     Right forearm: Tenderness present. No swelling, edema, deformity, lacerations or bony tenderness.     Left forearm: Normal. No swelling or edema.     Right wrist: Tenderness present. No effusion, lacerations, bony tenderness, snuff box tenderness or crepitus. Normal range of motion. Normal pulse.     Right hand: Deformity (heberden and bouchard nodes B) present. No swelling, tenderness or bony tenderness. Normal strength. Normal sensation.     Left hand: Normal. No swelling.       Hands:  Neurological:     Mental Status: She is alert.      No results found for any visits on 01/30/24.    The ASCVD Risk score (Arnett DK, et al., 2019) failed to calculate for the following reasons:   Cannot find a previous HDL lab   Cannot find a previous total cholesterol lab  Assessment & Plan:  Extensor carpi ulnaris tendinitis -     Diclofenac Sodium; Take 1 tablet (75 mg total) by mouth 2 (two) times daily.  Dispense: 30 tablet; Refill: 0  Assessment and Plan    Right wrist ulnar tenosynovitis Chronic right wrist pain and swelling for three weeks, worsening over the last three days. Pain localized over the ulnar side, exacerbated by movement. Diagnosis of ulnar tenosynovitis due to overuse is likely. No imaging needed due to lack of trauma and absence of bruising. Emphasized immobility to prevent further irritation and swelling. - Advise immobilization with a cock-up splint for two weeks, remove only for bathing. - Prescribe stronger prescription anti-inflammatory medication with food, at breakfast and dinner. - Instruct to avoid tasks requiring brace removal. - Provide rehabilitation exercises post two weeks of immobilization. - Re-evaluate in two weeks; consider imaging if no improvement.         No  follow-ups on file.   Benton LITTIE Gave, PA

## 2024-02-16 ENCOUNTER — Ambulatory Visit: Payer: Self-pay

## 2024-02-16 NOTE — Telephone Encounter (Signed)
 FYI Only or Action Required?: FYI only for provider: appointment scheduled on 02/18/24.  Patient was last seen in primary care on 01/30/2024 by Lowella Benton CROME, PA.  Called Nurse Triage reporting Joint Swelling.  Symptoms began several weeks ago.  Interventions attempted: Prescription medications: voltaran, Rest, hydration, or home remedies, and Ice/heat application.  Symptoms are: unchanged.  Triage Disposition: See PCP When Office is Open (Within 3 Days)  Patient/caregiver understands and will follow disposition?: Yes   Copied from CRM [id number]. Topic: Clinical - Red Word Triage >> Feb 16, 2024 10:18 AM Mercer PEDLAR wrote: Red Word that prompted transfer to Nurse Triage: Swollen wrist. Reason for Disposition  Swollen joint of new-onset  Answer Assessment - Initial Assessment Questions Pt states she was seen recently for Right wrist swelling and pain. She was told to wear a brace for 2 weeks. She did that. On Friday she began doing the exercises W. Crain gave her to do. She states she isn't having any more pain pt still having some swelling. She states that she took her last pill prescribed by [facility] on Friday. She states when she wakes up in the am her hand is stiff.      1. ONSET: When did the pain start?     A couple of weeks ago 2. LOCATION: Where is the pain located?     Right wrist 3. PAIN: How bad is the pain? (Scale 1-10; or mild, moderate, severe)     No pain currently 4. WORK OR EXERCISE: Has there been any recent work or exercise that involved this part (i.e., hand or wrist) of the body?     no 5. CAUSE: What do you think is causing the pain?     unknown  7. OTHER SYMPTOMS: Do you have any other symptoms? (e.g., fever, neck pain, numbness or tingling, rash, swelling)     Swelling, stiffness  Protocols used: Wrist Pain-A-AH

## 2024-02-18 ENCOUNTER — Ambulatory Visit: Payer: Self-pay | Admitting: Family Medicine

## 2024-02-18 ENCOUNTER — Encounter: Payer: Self-pay | Admitting: Family Medicine

## 2024-02-18 ENCOUNTER — Ambulatory Visit (INDEPENDENT_AMBULATORY_CARE_PROVIDER_SITE_OTHER): Admitting: Family Medicine

## 2024-02-18 ENCOUNTER — Ambulatory Visit

## 2024-02-18 VITALS — BP 139/88 | HR 79 | Temp 97.5°F | Ht 63.0 in | Wt 234.0 lb

## 2024-02-18 DIAGNOSIS — M19031 Primary osteoarthritis, right wrist: Secondary | ICD-10-CM | POA: Diagnosis not present

## 2024-02-18 DIAGNOSIS — M25531 Pain in right wrist: Secondary | ICD-10-CM

## 2024-02-18 DIAGNOSIS — M8580 Other specified disorders of bone density and structure, unspecified site: Secondary | ICD-10-CM | POA: Diagnosis not present

## 2024-02-18 DIAGNOSIS — M1811 Unilateral primary osteoarthritis of first carpometacarpal joint, right hand: Secondary | ICD-10-CM | POA: Diagnosis not present

## 2024-02-18 NOTE — Progress Notes (Signed)
   Acute Office Visit  Subjective:     Patient ID: [name], female    DOB: [birth year], 73 y.o.   MRN: [id number]  Chief Complaint  Patient presents with   Wrist Pain    Right wrist aching x 6 weeks. Has seen Dr. Kelby was given a brace and Diclofenac x 2 weeks. Has been doing wrist exercises. Fingers stiff when she wakes up and decreased grip strength.     HPI Patient is in today for right wrist aching.  Does not have pain. Pain started on lateral side of right wrist. Finger stiffness.  Aleve or tylenol  helps the ache. History of arthritis. Diclofenac and brace given per previous provider. Hand exercises have made fingers sore. Swelling in the morning. Symptoms improve as she gets moving in the morning.  Fell 6 months ago and landed on right side. Unsure of injury at that time.     ROS      Objective:    BP 139/88 (BP Location: Left Arm, Patient Position: Sitting, Cuff Size: Large)   Pulse 79   Temp (!) 97.5 F (36.4 C) (Oral)   Ht 5' 3 (1.6 m)   Wt 234 lb (106.1 kg)   SpO2 95%   BMI 41.45 kg/m    Physical Exam Vitals and nursing note reviewed.  Constitutional:      General: She is not in acute distress.    Appearance: Normal appearance.  Pulmonary:     Effort: Pulmonary effort is normal.  Musculoskeletal:     Right wrist: Swelling and tenderness present. Decreased range of motion.     Comments: Grip strength 4/5    Skin:    General: Skin is warm and dry.  Neurological:     General: No focal deficit present.     Mental Status: She is alert. Mental status is at baseline.  Psychiatric:        Mood and Affect: Mood normal.        Behavior: Behavior normal.        Thought Content: Thought content normal.        Judgment: Judgment normal.     No results found for any visits on 02/18/24.      Assessment & Plan:   Problem List Items Addressed This Visit     Wrist pain, acute, right - Primary   There is soft tissue swelling and tenderness  with palpation. Reports fall 6 months ago, unaware of injury at that time. Conservative treatment has not resolved symptoms. Will send for x-ray, may need referral to orthopedic specialist for evaluation.  Wrist brace is uncomfortable at night, ace wrap given for night time support.        Relevant Orders   DG Wrist Complete Right  Agrees with plan of care discussed.  Questions answered.      Return if symptoms worsen or fail to improve.  Darice JONELLE Brownie, FNP

## 2024-02-18 NOTE — Assessment & Plan Note (Addendum)
 There is soft tissue swelling and tenderness with palpation. Reports fall 6 months ago, unaware of injury at that time. Conservative treatment has not resolved symptoms. Will send for x-ray, may need referral to orthopedic specialist for evaluation.  Wrist brace is uncomfortable at night, ace wrap given for night time support.

## 2024-02-19 ENCOUNTER — Other Ambulatory Visit: Payer: Self-pay | Admitting: Family Medicine

## 2024-02-19 DIAGNOSIS — M19041 Primary osteoarthritis, right hand: Secondary | ICD-10-CM | POA: Insufficient documentation

## 2024-02-23 ENCOUNTER — Ambulatory Visit (INDEPENDENT_AMBULATORY_CARE_PROVIDER_SITE_OTHER): Admitting: Student

## 2024-02-23 ENCOUNTER — Ambulatory Visit (INDEPENDENT_AMBULATORY_CARE_PROVIDER_SITE_OTHER)

## 2024-02-23 ENCOUNTER — Other Ambulatory Visit (HOSPITAL_BASED_OUTPATIENT_CLINIC_OR_DEPARTMENT_OTHER): Payer: Self-pay

## 2024-02-23 DIAGNOSIS — M79642 Pain in left hand: Secondary | ICD-10-CM

## 2024-02-23 DIAGNOSIS — M180 Bilateral primary osteoarthritis of first carpometacarpal joints: Secondary | ICD-10-CM | POA: Diagnosis not present

## 2024-02-23 DIAGNOSIS — M19042 Primary osteoarthritis, left hand: Secondary | ICD-10-CM | POA: Diagnosis not present

## 2024-02-23 DIAGNOSIS — M1812 Unilateral primary osteoarthritis of first carpometacarpal joint, left hand: Secondary | ICD-10-CM | POA: Diagnosis not present

## 2024-02-23 MED ORDER — MELOXICAM 15 MG PO TABS
15.0000 mg | ORAL_TABLET | Freq: Every day | ORAL | 0 refills | Status: AC
  Filled 2024-02-23: qty 14, 14d supply, fill #0

## 2024-02-23 NOTE — Progress Notes (Signed)
 Chief Complaint: Bilateral hand pain    Discussed the use of AI scribe software for clinical note transcription with the patient, who gave verbal consent to proceed.  History of Present Illness [name] is a pleasant 73 year old right-hand-dominant female who presents with bilateral hand swelling and stiffness.  Her symptoms began this summer with swelling in the right hand, particularly in the wrist and fingers, accompanied by a loss of strength. She experiences nocturnal paresthesia in the left 2nd through 4th fingers, and morning stiffness that improves with activity. Swelling has persisted since late September despite efforts to exercise and maintain hand activity. An x-ray ordered by her primary doctor showed osteoarthritis, particularly involving the thumb. She has been utilizing a brace and has trialed Aleve, diclofenac, and extra-strength Tylenol .  Symptoms are consistently waking her up at night.  Her medical history includes knee replacements and falls, with two falls this summer potentially impacting her hand. 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 She reports no significant pain in the wrist or fingers during the day, but swelling and stiffness remain her primary concerns. Her left wrist is less affected, but she is concerned about potential progression.   Surgical History:   None  PMH/PSH/Family History/Social History/Meds/Allergies:    Past Medical History:  Diagnosis Date   Allergic rhinitis    Allergy    Pollen   Cataract 2023   Begining   Family history of diabetes mellitus    History of pulmonary embolus (PE) 11/13/2009   post-op LLL PE   Hypertension 2020   Osteoarthritis    both knees (02/04/2014)   Other specified congenital anomaly of skin    Peripheral vascular disease    Urinary frequency    Past  Surgical History:  Procedure Laterality Date   JOINT REPLACEMENT     KNEE ARTHROSCOPY Bilateral X 3   2 on one side; one on the other   TONSILLECTOMY     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   coumadin  x 6 months   TOTAL KNEE ARTHROPLASTY Right 02/04/2014   TOTAL KNEE ARTHROPLASTY Right 02/04/2014   Procedure: RIGHT TOTAL KNEE ARTHROPLASTY;  Surgeon: Elspeth JONELLE Her, MD;  Location: [facility] OR;  Service: Orthopedics;  Laterality: Right;   TUBAL LIGATION  1983   Uterine Biopsy     RE: fibroid tumors   Social History   Socioeconomic History   Marital status: Married    Spouse name: Not on file   Number of children: Not on file   Years of education: Not on file   Highest education level: 12th grade  Occupational History   Not on file  Tobacco Use   Smoking status: Never   Smokeless tobacco: Never  Substance and Sexual Activity   Alcohol use: Not Currently    Comment: 02/04/2014 maybe once/month I'll have a mixed drink or ffruit lavored drink   Drug use: No   Sexual activity: Yes    Birth control/protection: None  Other Topics Concern   Not on file  Social History Narrative   Not on file   Social Drivers of Health   Financial Resource Strain: Low Risk  (01/30/2024)   Overall Financial Resource Strain (CARDIA)  Difficulty of Paying Living Expenses: Not hard at all  Food Insecurity: No Food Insecurity (01/30/2024)   Hunger Vital Sign    Worried About Running Out of Food in the Last Year: Never true    Ran Out of Food in the Last Year: Never true  Transportation Needs: No Transportation Needs (01/30/2024)   PRAPARE - Administrator, Civil Service (Medical): No    Lack of Transportation (Non-Medical): No  Physical Activity: Sufficiently Active (01/30/2024)   Exercise Vital Sign    Days of Exercise per Week: 5 days    Minutes of Exercise per Session: 30 min  Stress: No Stress Concern Present (01/30/2024)   [employer] of Occupational Health -  Occupational Stress Questionnaire    Feeling of Stress: Not at all  Social Connections: Socially Integrated (01/30/2024)   Social Connection and Isolation Panel    Frequency of Communication with Friends and Family: More than three times a week    Frequency of Social Gatherings with Friends and Family: Twice a week    Attends Religious Services: 1 to 4 times per year    Active Member of [employer] or Organizations: Yes    Attends Engineer, Structural: More than 4 times per year    Marital Status: Married   Family History  Problem Relation Age of Onset   Cervical cancer Mother    Diabetes type II Mother    Hiatal hernia Mother    Cancer Mother    Coronary artery disease Father        CABG   Diabetes Father    Cancer Father        bladder   Arrhythmia Father    Gout Father    Arthritis Father    COPD Father    Hearing loss Father    Heart disease Father    Osteoarthritis Paternal Grandmother    Cancer Paternal Grandfather    Diabetes Paternal Grandfather    Parkinsonism Maternal Grandmother    Other Maternal Grandfather        fluid in his lungs   Breast cancer Neg Hx    Allergies  Allergen Reactions   Penicillins Swelling   Current Outpatient Medications  Medication Sig Dispense Refill   meloxicam (MOBIC) 15 MG tablet Take 1 tablet (15 mg total) by mouth daily for 14 days. 14 tablet 0   hydrochlorothiazide  (HYDRODIURIL ) 25 MG tablet Take 1 tablet (25 mg total) by mouth daily. 90 tablet 1   No current facility-administered medications for this visit.   No results found.  Review of Systems:   A ROS was performed including pertinent positives and negatives as documented in the HPI.  Physical Exam :   Constitutional: NAD and appears stated age Neurological: Alert and oriented Psych: Appropriate affect and cooperative There were no vitals taken for this visit.   Comprehensive Musculoskeletal Exam:    Exam of the right wrist demonstrates mild to moderate  swelling particularly of the dorsum of the radial wrist as well as laterally over the ulnar styloid.  No significant discomfort with radial or ulnar deviation.  Patient is able to form a light fist.  Palpable deformity at bilateral CMC joints without tenderness and no pain reciprocated with CMC grind test.  Negative Tinel's test bilaterally.  Imaging:   Xray (left hand 3 views): Moderate to advanced CMC osteoarthritis with osteophyte formation, joint space narrowing, and subchondral cyst.  Degenerative changes also noted at the thumb MCP and IP.  I personally reviewed and interpreted the radiographs.      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 Symptoms do appear consistent with osteoarthritis as her symptoms do dramatically improved with motion and worsen at night.  No findings suggestive of carpal tunnel syndrome.  There may be a component of ECU tendinitis in the right wrist due to ulnar-sided pain and swelling as well.  Previous treatments with diclofenac and bracing have been ineffective thus far.  Will provide a short course of meloxicam 15 mg to take in the evening to see if this brings her more relief at night. Consider ultrasound-guided corticosteroid CMC injection if symptoms persist or worsen. Continue hand exercises as instructed by primary care. Discuss potential referral to a hand specialist if no improvement.       I personally saw and evaluated the patient, and participated in the management and treatment plan.  Leonce Reveal, PA-C Orthopedics

## 2024-03-01 ENCOUNTER — Encounter (HOSPITAL_BASED_OUTPATIENT_CLINIC_OR_DEPARTMENT_OTHER): Payer: Self-pay

## 2024-03-01 ENCOUNTER — Ambulatory Visit (HOSPITAL_BASED_OUTPATIENT_CLINIC_OR_DEPARTMENT_OTHER)
Admission: RE | Admit: 2024-03-01 | Discharge: 2024-03-01 | Disposition: A | Discharge status: Home / Self Care | Source: Ambulatory Visit | Attending: Family Medicine | Admitting: Family Medicine

## 2024-03-01 DIAGNOSIS — Z1231 Encounter for screening mammogram for malignant neoplasm of breast: Secondary | ICD-10-CM | POA: Diagnosis not present

## 2024-03-03 ENCOUNTER — Ambulatory Visit: Payer: Self-pay | Admitting: Family Medicine

## 2024-03-15 DIAGNOSIS — H04123 Dry eye syndrome of bilateral lacrimal glands: Secondary | ICD-10-CM | POA: Diagnosis not present

## 2024-03-15 DIAGNOSIS — H25813 Combined forms of age-related cataract, bilateral: Secondary | ICD-10-CM | POA: Diagnosis not present

## 2024-03-15 DIAGNOSIS — H18513 Endothelial corneal dystrophy, bilateral: Secondary | ICD-10-CM | POA: Diagnosis not present

## 2024-03-22 ENCOUNTER — Ambulatory Visit: Admitting: Family Medicine

## 2024-03-23 NOTE — Progress Notes (Unsigned)
 [name] - 73 y.o. female MRN [id number]  Date of birth: [birth year]  Office Visit Note: Visit Date: 03/24/2024 PCP: Booker Darice SAUNDERS, FNP Referred by: Booker Darice SAUNDERS, FNP  Subjective: No chief complaint on file.  HPI: [name] is a pleasant 73 y.o. female who presents today for ***  Pertinent ROS were reviewed with the patient and found to be negative unless otherwise specified above in HPI.   Visit Reason: Duration of symptoms: Hand dominance: {RIGHT/LEFT:20294} Occupation: Diabetic: {yes/no:20286} Smoking: {yes/no:20286} Heart/Lung History: Blood Thinners:   Prior Testing/EMG: Injections (Date): Treatments: Prior Surgery:    Assessment & Plan: Visit Diagnoses: No diagnosis found.  Plan: ***  Follow-up: No follow-ups on file.   Meds & Orders: No orders of the defined types were placed in this encounter.  No orders of the defined types were placed in this encounter.    Procedures: No procedures performed      Clinical History: No specialty comments available.  She reports that she has never smoked. She has never used smokeless tobacco.  Recent Labs    09/23/23 1351  HGBA1C 5.6    Objective:   Vital Signs: There were no vitals taken for this visit.  Physical Exam  Gen: Well-appearing, in no acute distress; non-toxic CV: Regular Rate. Well-perfused. Warm.  Resp: Breathing unlabored on room air; no wheezing. Psych: Fluid speech in conversation; appropriate affect; normal thought process  Ortho Exam - ***   Imaging: No results found.  Past Medical/Family/Surgical/Social History: Medications & Allergies reviewed per EMR, new medications updated. Patient Active Problem List   Diagnosis Date Noted   Osteoarthritis of right hand 02/19/2024   Wrist pain, acute, right 02/18/2024   Prediabetes 09/23/2023   Hearing loss 10/27/2019   Shingles 05/24/2019   Encounter for screening for metabolic disorder 04/21/2019   Elevated random blood  glucose level 12/16/2017   Subclinical hypothyroidism 12/16/2017   Essential hypertension 11/28/2017   Localized deposits of fat 11/30/2014   Obesity 11/30/2014   Arthritis of knee, right 02/04/2014   History of anemia 12/06/2013   History of pulmonary embolism 01/02/2010   Past Medical History:  Diagnosis Date   Allergic rhinitis    Allergy    Pollen   Cataract 2023   Begining   Family history of diabetes mellitus    History of pulmonary embolus (PE) 11/13/2009   post-op LLL PE   Hypertension 2020   Osteoarthritis    both knees (02/04/2014)   Other specified congenital anomaly of skin    Peripheral vascular disease    Urinary frequency    Family History  Problem Relation Age of Onset   Cervical cancer Mother    Diabetes type II Mother    Hiatal hernia Mother    Cancer Mother    Coronary artery disease Father        CABG   Diabetes Father    Cancer Father        bladder   Arrhythmia Father    Gout Father    Arthritis Father    COPD Father    Hearing loss Father    Heart disease Father    Osteoarthritis Paternal Grandmother    Cancer Paternal Grandfather    Diabetes Paternal Grandfather    Parkinsonism Maternal Grandmother    Other Maternal Grandfather        fluid in his lungs   Breast cancer Neg Hx    Past Surgical History:  Procedure Laterality Date  JOINT REPLACEMENT     KNEE ARTHROSCOPY Bilateral X 3   2 on one side; one on the other   TONSILLECTOMY     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   coumadin  x 6 months   TOTAL KNEE ARTHROPLASTY Right 02/04/2014   TOTAL KNEE ARTHROPLASTY Right 02/04/2014   Procedure: RIGHT TOTAL KNEE ARTHROPLASTY;  Surgeon: Elspeth JONELLE Her, MD;  Location[facility] OR;  Service: Orthopedics;  Laterality: Right;   TUBAL LIGATION  1983   Uterine Biopsy     RE: fibroid tumors   Social History   Occupational History   Not on file  Tobacco Use   Smoking status: Never   Smokeless tobacco: Never  Substance and Sexual  Activity   Alcohol use: Not Currently    Comment: 02/04/2014 maybe once/month I'll have a mixed drink or ffruit lavored drink   Drug use: No   Sexual activity: Yes    Birth control/protection: None    Derell Bruun Afton Alderton, M.D. [facility], Hand Surgery

## 2024-03-24 ENCOUNTER — Ambulatory Visit (INDEPENDENT_AMBULATORY_CARE_PROVIDER_SITE_OTHER): Admitting: Orthopedic Surgery

## 2024-03-24 DIAGNOSIS — M180 Bilateral primary osteoarthritis of first carpometacarpal joints: Secondary | ICD-10-CM | POA: Diagnosis not present

## 2024-03-24 DIAGNOSIS — R200 Anesthesia of skin: Secondary | ICD-10-CM

## 2024-03-31 ENCOUNTER — Telehealth: Payer: Self-pay | Admitting: Physical Medicine and Rehabilitation

## 2024-03-31 NOTE — Telephone Encounter (Signed)
 Patient called. Would like to [facility]

## 2024-04-04 ENCOUNTER — Other Ambulatory Visit: Payer: Self-pay | Admitting: Family Medicine

## 2024-04-04 DIAGNOSIS — I10 Essential (primary) hypertension: Secondary | ICD-10-CM

## 2024-04-21 ENCOUNTER — Encounter: Admitting: Physical Medicine and Rehabilitation

## 2024-06-29 ENCOUNTER — Ambulatory Visit
